# Patient Record
Sex: Female | Born: 1950 | Race: White | Hispanic: No | Marital: Married | Smoking: Never smoker
Health system: Southern US, Community
[De-identification: ages and names within clinical notes are randomized; demographics above are authoritative.]

## PROBLEM LIST (undated history)

## (undated) DIAGNOSIS — H269 Unspecified cataract: Secondary | ICD-10-CM

## (undated) DIAGNOSIS — E7800 Pure hypercholesterolemia, unspecified: Secondary | ICD-10-CM

## (undated) DIAGNOSIS — M810 Age-related osteoporosis without current pathological fracture: Secondary | ICD-10-CM

## (undated) DIAGNOSIS — C801 Malignant (primary) neoplasm, unspecified: Secondary | ICD-10-CM

## (undated) DIAGNOSIS — F419 Anxiety disorder, unspecified: Secondary | ICD-10-CM

## (undated) DIAGNOSIS — E785 Hyperlipidemia, unspecified: Secondary | ICD-10-CM

## (undated) DIAGNOSIS — I10 Essential (primary) hypertension: Secondary | ICD-10-CM

## (undated) HISTORY — DX: Hyperlipidemia, unspecified: E78.5

## (undated) HISTORY — PX: COLONOSCOPY: SHX174

## (undated) HISTORY — PX: ESOPHAGOGASTRODUODENOSCOPY: SHX1529

## (undated) HISTORY — DX: Pure hypercholesterolemia, unspecified: E78.00

## (undated) HISTORY — DX: Unspecified cataract: H26.9

## (undated) HISTORY — DX: Age-related osteoporosis without current pathological fracture: M81.0

---
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
## 1980-09-22 HISTORY — PX: TUBAL LIGATION: SHX77

## 2000-09-22 HISTORY — PX: FACIAL COSMETIC SURGERY: SHX629

## 2005-01-13 ENCOUNTER — Ambulatory Visit: Payer: Self-pay | Admitting: Unknown Physician Specialty

## 2005-01-13 LAB — HM COLONOSCOPY

## 2006-03-09 ENCOUNTER — Ambulatory Visit: Payer: Self-pay | Admitting: Internal Medicine

## 2008-03-23 IMAGING — US SCREENING ULTRASOUND OF ABDOMINAL AORTA
1 series · 15 of 15 positions shown · non-contrast
Comparison: none

REASON FOR EXAM: Abd Bruit Pulsating Mass
COMMENTS:

PROCEDURE:     US  - US EXAM AAA SCREENING  - March 09, 2006  [DATE]
RESULT:     The abdominal aorta is visualized and is normal in appearance.
No aneurysm is seen.  No periaortic mass is noted.

[Series 1: screening ultrasound of abdominal aorta · 15 of 15 slices shown]
[im 1/15]
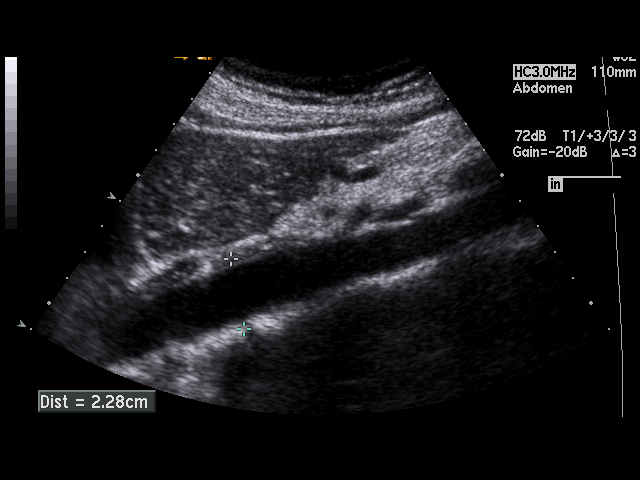
[im 2/15]
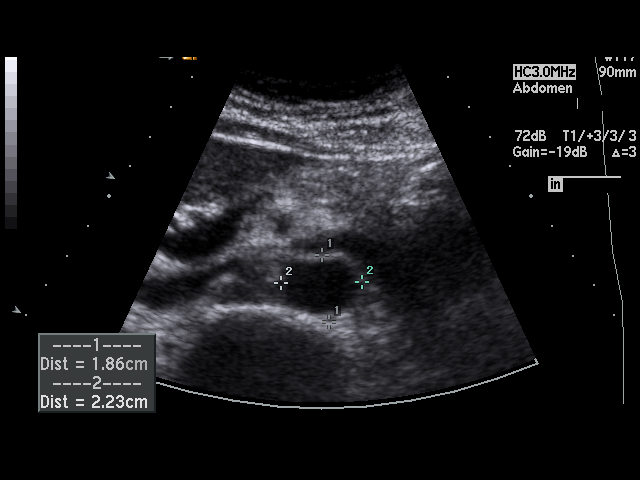
[im 3/15]
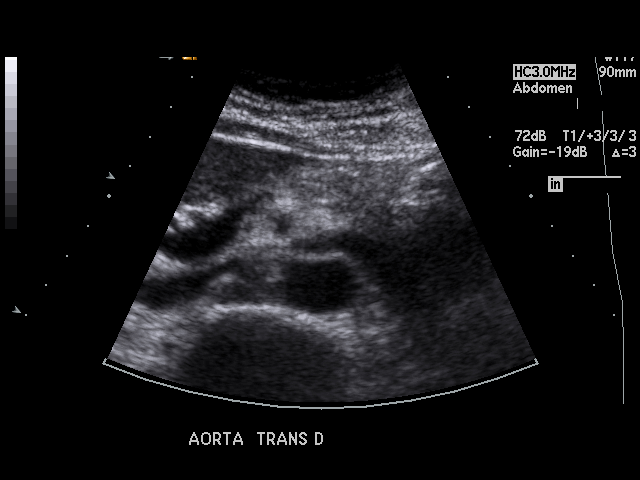
[im 4/15]
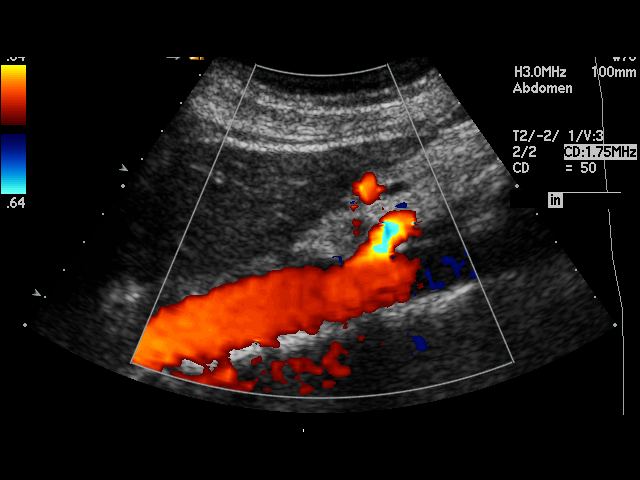
[im 5/15]
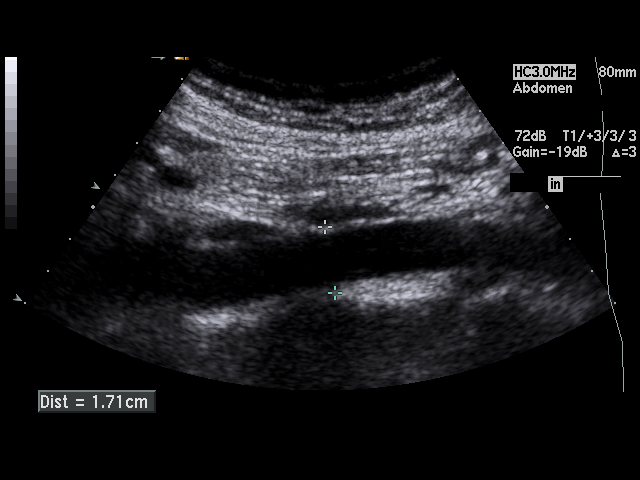
[im 6/15]
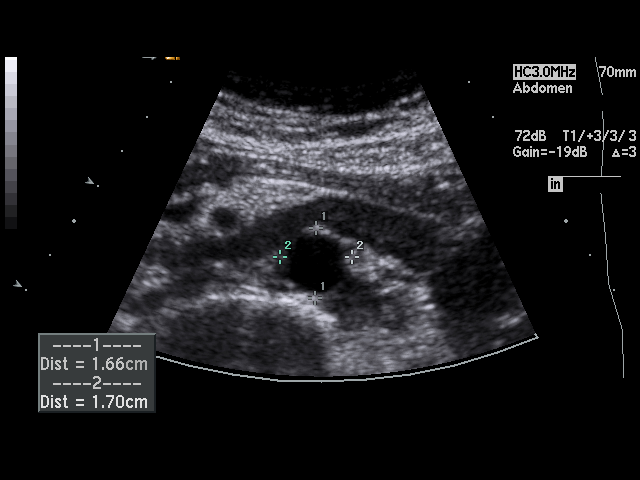
[im 7/15]
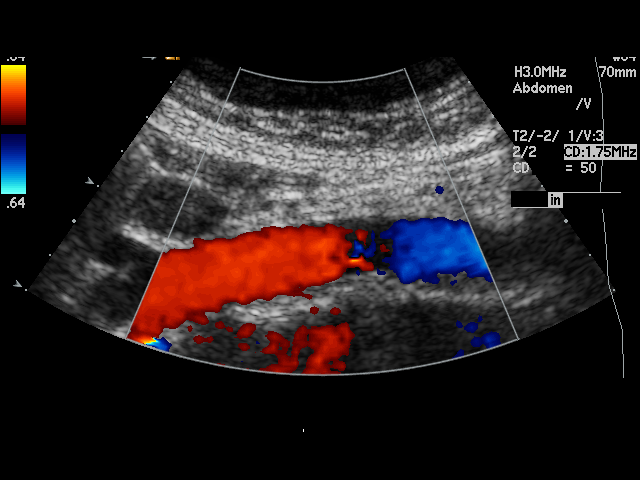
[im 8/15]
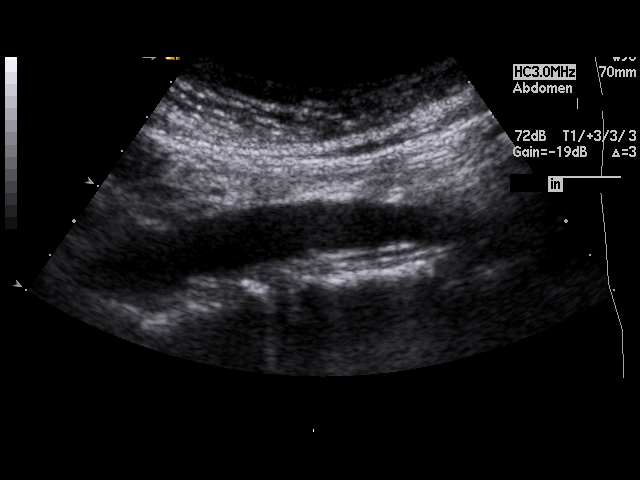
[im 9/15]
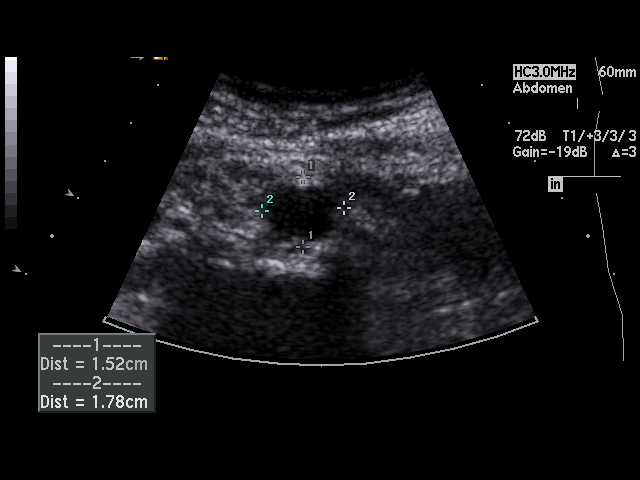
[im 10/15]
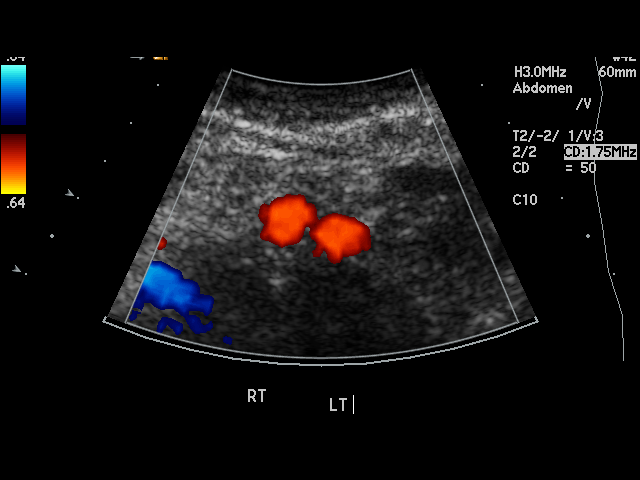
[im 11/15]
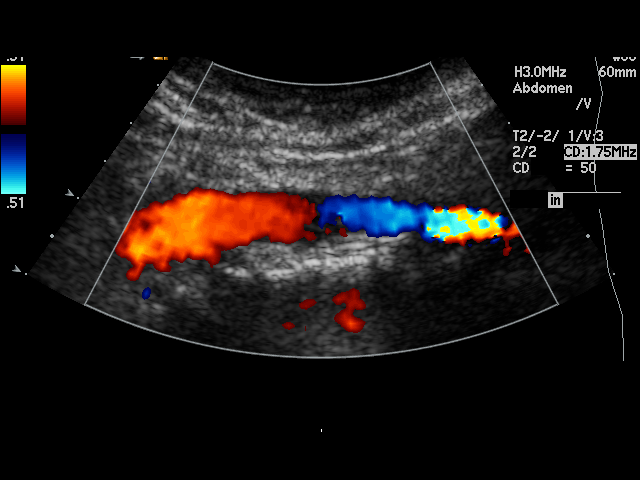
[im 12/15]
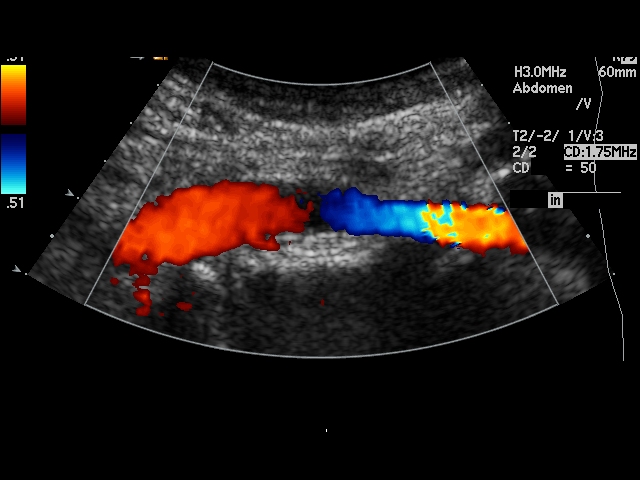
[im 13/15]
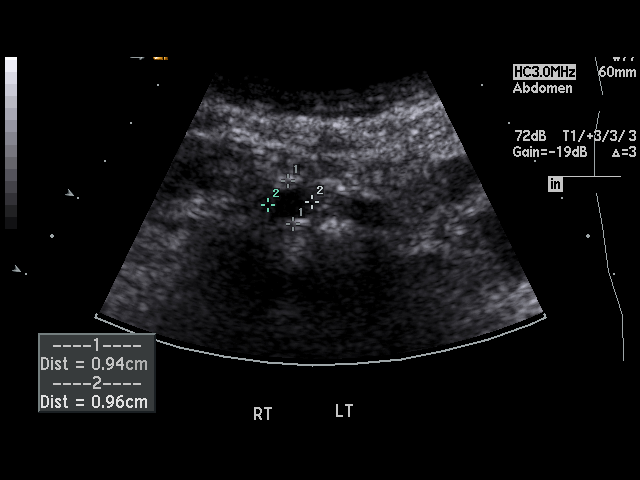
[im 14/15]
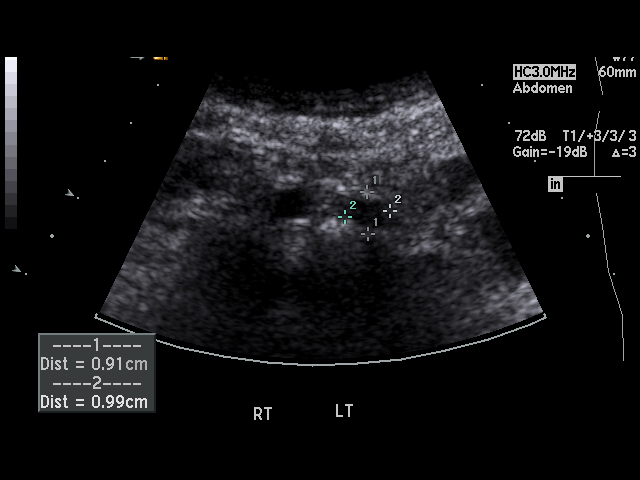
[im 15/15]
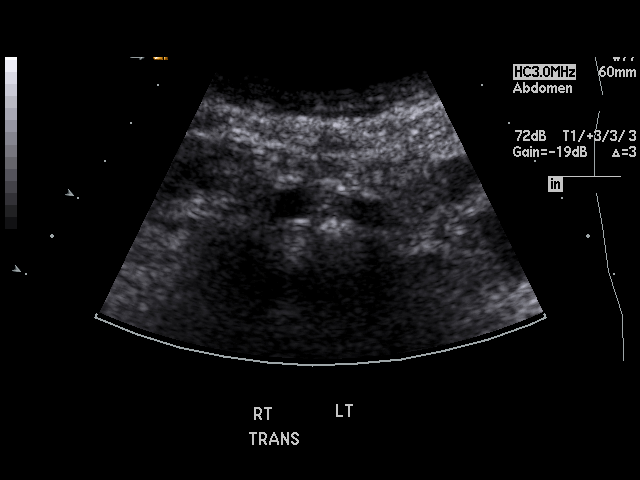

[15 of 15 positions shown; findings below may reference images not displayed]

IMPRESSION: No significant abnormalities are noted.

## 2010-06-11 LAB — HM PAP SMEAR: HM Pap smear: NEGATIVE

## 2011-07-30 LAB — IFOBT (OCCULT BLOOD): IFOBT: NEGATIVE

## 2012-03-08 LAB — HM MAMMOGRAPHY

## 2012-10-25 ENCOUNTER — Ambulatory Visit: Payer: Self-pay | Admitting: Internal Medicine

## 2012-11-02 ENCOUNTER — Encounter: Payer: Self-pay | Admitting: Internal Medicine

## 2012-11-02 DIAGNOSIS — E7800 Pure hypercholesterolemia, unspecified: Secondary | ICD-10-CM | POA: Insufficient documentation

## 2012-11-02 DIAGNOSIS — R519 Headache, unspecified: Secondary | ICD-10-CM | POA: Insufficient documentation

## 2012-11-02 DIAGNOSIS — R51 Headache: Secondary | ICD-10-CM | POA: Insufficient documentation

## 2012-11-02 DIAGNOSIS — E039 Hypothyroidism, unspecified: Secondary | ICD-10-CM

## 2012-11-02 DIAGNOSIS — M810 Age-related osteoporosis without current pathological fracture: Secondary | ICD-10-CM | POA: Insufficient documentation

## 2012-11-07 ENCOUNTER — Encounter: Payer: Self-pay | Admitting: Internal Medicine

## 2012-11-08 ENCOUNTER — Ambulatory Visit (INDEPENDENT_AMBULATORY_CARE_PROVIDER_SITE_OTHER): Payer: BC Managed Care – PPO | Admitting: Internal Medicine

## 2012-11-08 ENCOUNTER — Encounter: Payer: Self-pay | Admitting: Internal Medicine

## 2012-11-08 ENCOUNTER — Ambulatory Visit (INDEPENDENT_AMBULATORY_CARE_PROVIDER_SITE_OTHER)
Admission: RE | Admit: 2012-11-08 | Discharge: 2012-11-08 | Disposition: A | Discharge status: Home / Self Care | Payer: BC Managed Care – PPO | Source: Ambulatory Visit | Attending: Internal Medicine | Admitting: Internal Medicine

## 2012-11-08 ENCOUNTER — Other Ambulatory Visit (HOSPITAL_COMMUNITY)
Admission: RE | Admit: 2012-11-08 | Discharge: 2012-11-08 | Disposition: A | Discharge status: Home / Self Care | Payer: BC Managed Care – PPO | Source: Ambulatory Visit | Attending: Internal Medicine | Admitting: Internal Medicine

## 2012-11-08 VITALS — BP 150/84 | HR 78 | Temp 97.6°F | Ht 66.5 in | Wt 157.0 lb

## 2012-11-08 DIAGNOSIS — R5381 Other malaise: Secondary | ICD-10-CM

## 2012-11-08 DIAGNOSIS — Z1151 Encounter for screening for human papillomavirus (HPV): Secondary | ICD-10-CM | POA: Insufficient documentation

## 2012-11-08 DIAGNOSIS — R51 Headache: Secondary | ICD-10-CM

## 2012-11-08 DIAGNOSIS — N390 Urinary tract infection, site not specified: Secondary | ICD-10-CM

## 2012-11-08 DIAGNOSIS — R05 Cough: Secondary | ICD-10-CM

## 2012-11-08 DIAGNOSIS — R0602 Shortness of breath: Secondary | ICD-10-CM

## 2012-11-08 DIAGNOSIS — Z124 Encounter for screening for malignant neoplasm of cervix: Secondary | ICD-10-CM

## 2012-11-08 DIAGNOSIS — M810 Age-related osteoporosis without current pathological fracture: Secondary | ICD-10-CM

## 2012-11-08 DIAGNOSIS — Z1239 Encounter for other screening for malignant neoplasm of breast: Secondary | ICD-10-CM

## 2012-11-08 DIAGNOSIS — Z01419 Encounter for gynecological examination (general) (routine) without abnormal findings: Secondary | ICD-10-CM | POA: Insufficient documentation

## 2012-11-08 DIAGNOSIS — R059 Cough, unspecified: Secondary | ICD-10-CM

## 2012-11-08 DIAGNOSIS — E7800 Pure hypercholesterolemia, unspecified: Secondary | ICD-10-CM

## 2012-11-08 LAB — POCT URINALYSIS DIPSTICK
Glucose, UA: NEGATIVE
Spec Grav, UA: <=1.005
Urobilinogen, UA: 0.2
pH, UA: 5.5

## 2012-11-08 LAB — COMPREHENSIVE METABOLIC PANEL
AST: 19 U/L (ref 0–37)
Albumin: 4.1 g/dL (ref 3.5–5.2)
BUN: 14 mg/dL (ref 6–23)
Calcium: 9.3 mg/dL (ref 8.4–10.5)
Chloride: 103 mEq/L (ref 96–112)
Glucose, Bld: 97 mg/dL (ref 70–99)
Potassium: 3.7 mEq/L (ref 3.5–5.1)
Sodium: 137 mEq/L (ref 135–145)
Total Protein: 7.7 g/dL (ref 6.0–8.3)

## 2012-11-08 LAB — CBC WITH DIFFERENTIAL/PLATELET
Basophils Relative: 0.9 % (ref 0.0–3.0)
Eosinophils Absolute: 0.1 10*3/uL (ref 0.0–0.7)
Eosinophils Relative: 1.1 % (ref 0.0–5.0)
Lymphocytes Relative: 15.8 % (ref 12.0–46.0)
Neutrophils Relative %: 73.8 % (ref 43.0–77.0)
Platelets: 252 10*3/uL (ref 150.0–400.0)
RBC: 4.5 Mil/uL (ref 3.87–5.11)
WBC: 8.9 10*3/uL (ref 4.5–10.5)

## 2012-11-08 LAB — LIPID PANEL
Cholesterol: 222 mg/dL — ABNORMAL HIGH (ref 0–200)
Total CHOL/HDL Ratio: 3
VLDL: 12.8 mg/dL (ref 0.0–40.0)

## 2012-11-08 MED ORDER — FLUTICASONE PROPIONATE 50 MCG/ACT NA SUSP: 2.0000 | Freq: Every day | NASAL | Status: DC

## 2012-11-08 NOTE — Progress Notes (Signed)
Subjective:    Patient ID: Dana Carney, female    DOB: 09-20-1951, 62 y.o.   MRN: [id number]  HPI 62 year old female with past history of hypercholesterolemia and osteoporosis who comes in today to follow up on these issues as well as for a complete physical exam.  She states she is doing relatively well.  Increased stress at work.  Increased cough - persistent.  Some drainage.  No sinus pressure and no chest congestion.  States the cough has been persistent.  No significant sob with increased activity or exertion.  Some feeling of needing to get a good breath - with coughing.  No chest pain or tightness.  No acid reflux.  Eating and drinking well.  Bowels stable.   Past Medical History  Diagnosis Date  . Hypercholesterolemia   . Osteoporosis     Current Outpatient Prescriptions on File Prior to Visit  Medication Sig Dispense Refill  . Coenzyme Q10 (COQ10 PO) Take by mouth.      . Red Yeast Rice 600 MG CAPS Take 1 capsule by mouth daily.       No current facility-administered medications on file prior to visit.    Review of Systems Patient denies any headache, lightheadedness or dizziness.  Some increased post nasal drainage.  No sinus pressure.  No chest pain, tightness or palpitations.  Does report increased cough and some sob after working.  No sob with increased walking or other exercise.  No acid reflux.  No nausea or vomiting.  No abdominal pain or cramping.  No bowel change, such as diarrhea, constipation, BRBPR or melana.  Was seen at [facility].  Diagnosed with a uti.  Was given abx.  Still with some increased frequency.  Symptoms improved.  Wants urine checked to confirm cleared.          Objective:   Physical Exam Filed Vitals:   11/08/12 0818  BP: 150/84  Pulse: 78  Temp: 97.6 F (36.4 C)   Blood pressure recheck:  72/47 62 year old female in no acute distress.   HEENT:  Nares- clear.  Oropharynx - without lesions. NECK:  Supple.  Nontender.  No audible bruit.   HEART:  Appears to be regular. LUNGS:  No crackles or wheezing audible.  Respirations even and unlabored.  RADIAL PULSE:  Equal bilaterally.    BREASTS:  No nipple discharge or nipple retraction present.  Could not appreciate any distinct nodules or axillary adenopathy.  ABDOMEN:  Soft, nontender.  Bowel sounds present and normal.  No audible abdominal bruit.  GU:  Normal external genitalia.  Vaginal vault without lesions.  Cervix identified.  Pap performed. Could not appreciate any adnexal masses or tenderness.   RECTAL:  Heme negative.   EXTREMITIES:  No increased edema present.  DP pulses palpable and equal bilaterally.           Assessment & Plan:  SINUS DRAINAGE.  Could be contributing to the cough.  Flonase as directed.  Restart Zyrtec.  Do not feel abx warranted.    PERSISTENT COUGH.  See above.  Will treat allergy symptoms.  Check cxr given persistent cough.    CARDIOVASCULAR.  Discussed further w/up including EKG.  She declines.  Currently feels from a cardiac standpoint - stable.  Of note, stress test 04/16/09 - negative for ischemia.   INCREASED PSYCHOSOCIAL STRESSORS.  Feels she is doing relatively well.  Coping with her sons death.  Does not feel she needs any further intervention.  Follow.  FATIGUE.  Did report some fatigue.  May be work related.  Will check cbc,met c and tsh.   HEALTH MAINTENANCE.  Physical today.  Needs colonoscopy.  Refer to GI.  Schedule mammogram.

## 2012-11-09 ENCOUNTER — Encounter: Payer: Self-pay | Admitting: Internal Medicine

## 2012-11-09 ENCOUNTER — Telehealth: Payer: Self-pay | Admitting: Internal Medicine

## 2012-11-09 ENCOUNTER — Ambulatory Visit: Payer: BC Managed Care – PPO

## 2012-11-09 DIAGNOSIS — R5381 Other malaise: Secondary | ICD-10-CM

## 2012-11-09 LAB — URINE CULTURE: Organism ID, Bacteria: NO GROWTH

## 2012-11-09 NOTE — Telephone Encounter (Signed)
I reviewed Dana Carney labs.  Her TSH is suppressed.  I wanted to see if I could add a free T4 and a free T3 to her blood drawn 11/08/12.  Let me know and I will place order for lab.  Thanks.

## 2012-11-09 NOTE — Assessment & Plan Note (Signed)
Low cholesterol diet.  Desires not to take cholesterol medication.  Check lipid panel.

## 2012-11-09 NOTE — Telephone Encounter (Signed)
Patient notified

## 2012-11-09 NOTE — Assessment & Plan Note (Signed)
Not an issue for her now.    

## 2012-11-09 NOTE — Telephone Encounter (Signed)
Patient waiting on results

## 2012-11-09 NOTE — Assessment & Plan Note (Signed)
Has documented osteoporosis.  Per review, bone density 11/27/08 - normal.  Follow.   

## 2012-11-10 ENCOUNTER — Other Ambulatory Visit: Payer: Self-pay | Admitting: Internal Medicine

## 2012-11-10 DIAGNOSIS — E039 Hypothyroidism, unspecified: Secondary | ICD-10-CM

## 2012-11-10 NOTE — Progress Notes (Signed)
Order placed for follow up thyroid test

## 2012-12-13 ENCOUNTER — Other Ambulatory Visit (INDEPENDENT_AMBULATORY_CARE_PROVIDER_SITE_OTHER): Payer: BC Managed Care – PPO

## 2012-12-13 DIAGNOSIS — E039 Hypothyroidism, unspecified: Secondary | ICD-10-CM

## 2012-12-13 LAB — TSH: TSH: 0.49 u[IU]/mL (ref 0.35–5.50)

## 2012-12-14 ENCOUNTER — Other Ambulatory Visit: Payer: Self-pay | Admitting: Internal Medicine

## 2012-12-14 DIAGNOSIS — E7800 Pure hypercholesterolemia, unspecified: Secondary | ICD-10-CM

## 2012-12-14 DIAGNOSIS — R7989 Other specified abnormal findings of blood chemistry: Secondary | ICD-10-CM

## 2012-12-14 NOTE — Progress Notes (Signed)
Orders placed for follow up labs 

## 2012-12-17 ENCOUNTER — Telehealth: Payer: Self-pay | Admitting: *Deleted

## 2012-12-17 NOTE — Telephone Encounter (Signed)
error 

## 2012-12-21 ENCOUNTER — Ambulatory Visit: Payer: Self-pay | Admitting: Unknown Physician Specialty

## 2013-01-19 ENCOUNTER — Encounter: Payer: Self-pay | Admitting: Internal Medicine

## 2013-04-01 ENCOUNTER — Telehealth: Payer: Self-pay | Admitting: Internal Medicine

## 2013-04-01 ENCOUNTER — Ambulatory Visit (INDEPENDENT_AMBULATORY_CARE_PROVIDER_SITE_OTHER): Payer: BC Managed Care – PPO | Admitting: Adult Health

## 2013-04-01 ENCOUNTER — Encounter: Payer: Self-pay | Admitting: Adult Health

## 2013-04-01 VITALS — BP 122/70 | HR 70 | Temp 97.9°F | Resp 12 | Wt 165.0 lb

## 2013-04-01 DIAGNOSIS — R609 Edema, unspecified: Secondary | ICD-10-CM

## 2013-04-01 NOTE — Telephone Encounter (Signed)
FYI patient seeing Raquel today at 11:15.

## 2013-04-01 NOTE — Progress Notes (Signed)
  Subjective:    Patient ID: Dana Carney, female    DOB: Jun 27, 1951, 62 y.o.   MRN: [id number]  HPI Patient is a pleasant 62 year old female who presents to clinic with complaints of left ankle swelling. She works at [employer] third shift and is on her feet the entire shift. She reports her legs feeling tired by the end of her shift. She is currently not using any support hose. Patient reports that when she does use her support hose her legs do not swell. She denies pain in the calf area.   Current Outpatient Prescriptions on File Prior to Visit  Medication Sig Dispense Refill  . calcium gluconate 500 MG tablet Take 500 mg by mouth 2 (two) times daily.      . Coenzyme Q10 (COQ10 PO) Take by mouth.      . fish oil-omega-3 fatty acids 1000 MG capsule Take 2 g by mouth daily.      . Red Yeast Rice 600 MG CAPS Take 1 capsule by mouth daily.      . fluticasone (FLONASE) 50 MCG/ACT nasal spray Place 2 sprays into the nose daily.  16 g  1   No current facility-administered medications on file prior to visit.     Review of Systems  Respiratory: Negative.   Cardiovascular: Positive for leg swelling. Negative for chest pain.       Left ankle swelling  Neurological: Negative for numbness.       Objective:   Physical Exam  Constitutional: She is oriented to person, place, and time. She appears well-developed and well-nourished. No distress.  Cardiovascular: Normal rate, regular rhythm and intact distal pulses.   Excellent pedal pulses  Pulmonary/Chest: Effort normal. No respiratory distress.  Musculoskeletal: She exhibits edema.  Trace edema bilateral lower extremity with just slight more accumulation around the left medial ankle.  Neurological: She is alert and oriented to person, place, and time.  Skin: Skin is warm and dry.  Psychiatric: She has a normal mood and affect. Her behavior is normal. Judgment and thought content normal.          Assessment & Plan:

## 2013-04-01 NOTE — Telephone Encounter (Signed)
Patient Information:  Caller Name: Agustin Cree  Phone: [phone]  Patient: Dana Carney, Dana Carney  Gender: Female  DOB: 10/09/1950  Age: 62 Years  PCP: Orville Govern  Office Follow Up:  Does the office need to follow up with this patient?: No  Instructions For The Office: N/A   Symptoms  Reason For Call & Symptoms: Works nights on concrete floors.  Has noted feeling slightly SOB at times lately.   Noticing Left foot with slight swelling for a week.  By end of work shift foot red on bottom, swelling above ankle with redness.  Difficulty walking due to pain, limping.  Reviewed Health History In EMR: Yes  Reviewed Medications In EMR: Yes  Reviewed Allergies In EMR: Yes  Reviewed Surgeries / Procedures: Yes  Date of Onset of Symptoms: 03/25/2013  Treatments Tried: Had propped up  foot, done epsom salt soaks with temporary relief  Treatments Tried Worked: No  Guideline(s) Used:  Ankle Pain  Disposition Per Guideline:   See Today in Office  Reason For Disposition Reached:   Redness of the skin and no fever  Advice Given:  N/A  Patient Will Follow Care Advice:  YES  Appointment Scheduled:  04/01/2013 11:15:00 Appointment Scheduled Provider:  Orville Govern

## 2013-04-01 NOTE — Assessment & Plan Note (Signed)
Minimal to trace edema bilateral lower extremity with slight more accumulation in the left medial ankle. Suspect this is secondary to being on her feet the entire time she is at work. Patient works at [employer] third shift. Start using support hose for work. Elevate lower extremities when possible. Watch sodium intake. Return to clinic if symptoms are not improved or if symptoms worsen.

## 2013-04-07 ENCOUNTER — Encounter: Payer: Self-pay | Admitting: Internal Medicine

## 2013-05-09 ENCOUNTER — Encounter: Payer: Self-pay | Admitting: Internal Medicine

## 2013-05-09 ENCOUNTER — Ambulatory Visit (INDEPENDENT_AMBULATORY_CARE_PROVIDER_SITE_OTHER): Payer: BC Managed Care – PPO | Admitting: Internal Medicine

## 2013-05-09 VITALS — BP 128/70 | HR 76 | Temp 98.2°F | Ht 66.5 in | Wt 162.8 lb

## 2013-05-09 DIAGNOSIS — M810 Age-related osteoporosis without current pathological fracture: Secondary | ICD-10-CM

## 2013-05-09 DIAGNOSIS — R7989 Other specified abnormal findings of blood chemistry: Secondary | ICD-10-CM

## 2013-05-09 DIAGNOSIS — R609 Edema, unspecified: Secondary | ICD-10-CM

## 2013-05-09 DIAGNOSIS — R51 Headache: Secondary | ICD-10-CM

## 2013-05-09 DIAGNOSIS — R946 Abnormal results of thyroid function studies: Secondary | ICD-10-CM

## 2013-05-09 DIAGNOSIS — E7800 Pure hypercholesterolemia, unspecified: Secondary | ICD-10-CM

## 2013-05-09 LAB — LIPID PANEL
Cholesterol: 239 mg/dL — ABNORMAL HIGH (ref 0–200)
HDL: 55.6 mg/dL (ref >39.00)
Total CHOL/HDL Ratio: 4
Triglycerides: 85 mg/dL (ref 0.0–149.0)
VLDL: 17 mg/dL (ref 0.0–40.0)

## 2013-05-09 LAB — T4, FREE: Free T4: 1 ng/dL (ref 0.60–1.60)

## 2013-05-09 LAB — T3, FREE: T3, Free: 2.7 pg/mL (ref 2.3–4.2)

## 2013-05-09 NOTE — Assessment & Plan Note (Signed)
Low cholesterol diet.  Desires not to take cholesterol medication.  Check lipid panel.

## 2013-05-09 NOTE — Assessment & Plan Note (Signed)
Has documented osteoporosis.  Per review, bone density 11/27/08 - normal.  Follow.   

## 2013-05-09 NOTE — Assessment & Plan Note (Signed)
Not an issue for her now.    

## 2013-05-09 NOTE — Patient Instructions (Signed)
Take zantac 150mg  - one per day.  Take 30 minutes before breakfast.   Also, nasacort - 2 sprays each nostril before bed each day.

## 2013-05-09 NOTE — Assessment & Plan Note (Signed)
Not an issue for her now.  Follow.  

## 2013-05-09 NOTE — Progress Notes (Signed)
Subjective:    Patient ID: Dana Carney, female    DOB: 04/20/1951, 62 y.o.   MRN: [id number]  HPI 62 year old female with past history of hypercholesterolemia and osteoporosis who comes in today for a scheduled follow up.  She states she is doing relatively well.  Increased stress at work.  Cough has resolved.  No sinus pressure and no chest congestion.  Does report some occasional hoarseness and some drainage.  No acid reflux.  Eating and drinking well.  Bowels stable.  She does report some minimal sob.  She also reports chest feels tight occasionally.  Stays active.    Past Medical History  Diagnosis Date  . Hypercholesterolemia   . Osteoporosis     Current Outpatient Prescriptions on File Prior to Visit  Medication Sig Dispense Refill  . calcium gluconate 500 MG tablet Take 500 mg by mouth 2 (two) times daily.      . Coenzyme Q10 (COQ10 PO) Take by mouth.      . fish oil-omega-3 fatty acids 1000 MG capsule Take 2 g by mouth daily.      . Red Yeast Rice 600 MG CAPS Take 1 capsule by mouth daily.      . fluticasone (FLONASE) 50 MCG/ACT nasal spray Place 2 sprays into the nose daily.  16 g  1   No current facility-administered medications on file prior to visit.    Review of Systems Patient denies any headache, lightheadedness or dizziness.  Some increased post nasal drainage.  No sinus pressure.  No chest pain or palpitations.  Some tightness at times. Some minimal sob.  Was questioning whether or not stress could cause some of these symptoms.   Does stay active at work.  No cough.  Feels at times her throat is closing. Feels more related to her esophagus.   No significant tightness.   No acid reflux noted.  No nausea or vomiting.  No swallowing difficulty.  No abdominal pain or cramping.  No bowel change, such as diarrhea, constipation, BRBPR or melana.  Increased stress.  Work stress and has stress with her husbands medical issues.  Feels she is handling stress relatively well.        Objective:   Physical Exam  Filed Vitals:   05/09/13 0804  BP: 128/70  Pulse: 76  Temp: 98.2 F (36.8 C)   Pulse 38 61 year old female in no acute distress.   HEENT:  Nares- clear.  Oropharynx - without lesions. NECK:  Supple.  Nontender.  No audible bruit.  HEART:  Appears to be regular. LUNGS:  No crackles or wheezing audible.  Respirations even and unlabored.  RADIAL PULSE:  Equal bilaterally. ABDOMEN:  Soft, nontender.  Bowel sounds present and normal.  No audible abdominal bruit.    EXTREMITIES:  No increased edema present.  DP pulses palpable and equal bilaterally.           Assessment & Plan:  SINUS DRAINAGE. Change to nasacort as directed.  Saline flushes.  Zyrtec if needed.    PERSISTENT COUGH.  Previous cxr clear.  Cough resolved.      CARDIOVASCULAR.  Discussed further w/up including EKG.  Of note, stress test 04/16/09 - negative for ischemia.  Discussed further cardiac w/u given symptoms.  She declines at this time.  Wants to monitor.  Will notify me if she changes her mind or if symptoms change.    INCREASED PSYCHOSOCIAL STRESSORS.  Feels she is doing relatively well.  Coping with  her sons death.  Does not feel she needs any further intervention.  Follow.    HEALTH MAINTENANCE.  Physical 11/08/12.  Mammogram 03/14/13 - Birads I.  Colonoscopy 12/21/12.

## 2013-05-10 ENCOUNTER — Encounter: Payer: Self-pay | Admitting: *Deleted

## 2013-05-16 ENCOUNTER — Encounter: Payer: Self-pay | Admitting: Internal Medicine

## 2013-07-11 ENCOUNTER — Ambulatory Visit: Payer: BC Managed Care – PPO | Admitting: Internal Medicine

## 2013-10-24 ENCOUNTER — Telehealth: Payer: Self-pay | Admitting: Internal Medicine

## 2013-10-24 ENCOUNTER — Other Ambulatory Visit: Payer: Self-pay | Admitting: Internal Medicine

## 2013-10-24 ENCOUNTER — Ambulatory Visit (INDEPENDENT_AMBULATORY_CARE_PROVIDER_SITE_OTHER): Payer: BC Managed Care – PPO | Admitting: Internal Medicine

## 2013-10-24 ENCOUNTER — Encounter: Payer: Self-pay | Admitting: Internal Medicine

## 2013-10-24 VITALS — BP 138/80 | HR 72 | Temp 98.2°F | Ht 66.5 in | Wt 165.8 lb

## 2013-10-24 DIAGNOSIS — E7800 Pure hypercholesterolemia, unspecified: Secondary | ICD-10-CM

## 2013-10-24 DIAGNOSIS — H3530 Unspecified macular degeneration: Secondary | ICD-10-CM

## 2013-10-24 DIAGNOSIS — R51 Headache: Secondary | ICD-10-CM

## 2013-10-24 DIAGNOSIS — R0989 Other specified symptoms and signs involving the circulatory and respiratory systems: Secondary | ICD-10-CM

## 2013-10-24 DIAGNOSIS — R5383 Other fatigue: Secondary | ICD-10-CM

## 2013-10-24 DIAGNOSIS — M810 Age-related osteoporosis without current pathological fracture: Secondary | ICD-10-CM

## 2013-10-24 DIAGNOSIS — R0609 Other forms of dyspnea: Secondary | ICD-10-CM

## 2013-10-24 DIAGNOSIS — R5381 Other malaise: Secondary | ICD-10-CM

## 2013-10-24 DIAGNOSIS — R0602 Shortness of breath: Secondary | ICD-10-CM

## 2013-10-24 LAB — CBC WITH DIFFERENTIAL/PLATELET
BASOS ABS: 0 10*3/uL (ref 0.0–0.1)
Basophils Relative: 0.4 % (ref 0.0–3.0)
Eosinophils Absolute: 0.2 10*3/uL (ref 0.0–0.7)
Eosinophils Relative: 2.6 % (ref 0.0–5.0)
HEMATOCRIT: 43.5 % (ref 36.0–46.0)
HEMOGLOBIN: 14.4 g/dL (ref 12.0–15.0)
LYMPHS ABS: 1.7 10*3/uL (ref 0.7–4.0)
LYMPHS PCT: 24.4 % (ref 12.0–46.0)
MCHC: 33.1 g/dL (ref 30.0–36.0)
MCV: 94.2 fl (ref 78.0–100.0)
MONOS PCT: 5.5 % (ref 3.0–12.0)
Monocytes Absolute: 0.4 10*3/uL (ref 0.1–1.0)
Neutro Abs: 4.7 10*3/uL (ref 1.4–7.7)
Neutrophils Relative %: 67.1 % (ref 43.0–77.0)
PLATELETS: 265 10*3/uL (ref 150.0–400.0)
RBC: 4.61 Mil/uL (ref 3.87–5.11)
RDW: 13.7 % (ref 11.5–14.6)
WBC: 7.1 10*3/uL (ref 4.5–10.5)

## 2013-10-24 LAB — COMPREHENSIVE METABOLIC PANEL
ALT: 26 U/L (ref 0–35)
AST: 23 U/L (ref 0–37)
Albumin: 4.2 g/dL (ref 3.5–5.2)
Alkaline Phosphatase: 67 U/L (ref 39–117)
BILIRUBIN TOTAL: 1.1 mg/dL (ref 0.3–1.2)
BUN: 21 mg/dL (ref 6–23)
CHLORIDE: 107 meq/L (ref 96–112)
CO2: 25 mEq/L (ref 19–32)
CREATININE: 0.7 mg/dL (ref 0.4–1.2)
Calcium: 9.7 mg/dL (ref 8.4–10.5)
GFR: 98.29 mL/min (ref >60.00)
GLUCOSE: 95 mg/dL (ref 70–99)
Potassium: 3.8 mEq/L (ref 3.5–5.1)
Sodium: 140 mEq/L (ref 135–145)
TOTAL PROTEIN: 6.7 g/dL (ref 6.0–8.3)

## 2013-10-24 LAB — TSH: TSH: 0.82 u[IU]/mL (ref 0.35–5.50)

## 2013-10-24 LAB — LDL CHOLESTEROL, DIRECT: Direct LDL: 166.5 mg/dL

## 2013-10-24 LAB — LIPID PANEL
Cholesterol: 252 mg/dL — ABNORMAL HIGH (ref 0–200)
HDL: 75.7 mg/dL (ref >39.00)
TRIGLYCERIDES: 53 mg/dL (ref 0.0–149.0)
Total CHOL/HDL Ratio: 3
VLDL: 10.6 mg/dL (ref 0.0–40.0)

## 2013-10-24 NOTE — Progress Notes (Signed)
Orders placed for f/u labs.  

## 2013-10-24 NOTE — Progress Notes (Signed)
  Subjective:    Patient ID: Dana Carney, female    DOB: Nov 04, 1950, 63 y.o.   MRN: [id number]  HPI 63 year old female with past history of hypercholesterolemia and osteoporosis who comes in today for a scheduled follow up.  She states she is doing relatively well.  Increased stress at work.  Planning to retire soon.   No sinus pressure and no chest congestion.  No acid reflux.  Eating and drinking well.  Bowels stable.  She does report noticing some sob with exertion.  Occasional chest pain/discomfort associated.  Increased fatigue.  She would like her Lyme titer checked.  Bowels stable.  She is seeing opthalmology for macular degeneration.  Has watery eyes.  Is contemplating a second opinion.     Past Medical History  Diagnosis Date  . Hypercholesterolemia   . Osteoporosis     Current Outpatient Prescriptions on File Prior to Visit  Medication Sig Dispense Refill  . calcium gluconate 500 MG tablet Take 500 mg by mouth 2 (two) times daily.      . Coenzyme Q10 (COQ10 PO) Take by mouth.      . fish oil-omega-3 fatty acids 1000 MG capsule Take 2 g by mouth daily.      . Red Yeast Rice 600 MG CAPS Take 1 capsule by mouth daily.      . fluticasone (FLONASE) 50 MCG/ACT nasal spray Place 2 sprays into the nose daily.  16 g  1   No current facility-administered medications on file prior to visit.    Review of Systems Patient denies any headache, lightheadedness or dizziness.  No sinus pressure or congestion.   No cough.  She does report some sob with exertion.  Some occasional chest discomfort.   Does stay active at work.   No acid reflux noted.  No nausea or vomiting.  No swallowing difficulty.  No abdominal pain or cramping.  No bowel change, such as diarrhea, constipation, BRBPR or melana.  Increased stress.  Work stress and has stress with her husbands medical issues.  Feels she is handling stress relatively well.  Seeing opthalmology as outlined.        Objective:   Physical Exam  Filed  Vitals:   10/24/13 0757  BP: 138/80  Pulse: 72  Temp: 98.2 F (36.8 C)   Blood pressure recheck:  52/30  63 year old female in no acute distress.   HEENT:  Nares- clear.  Oropharynx - without lesions. NECK:  Supple.  Nontender.  No audible bruit.  HEART:  Appears to be regular. LUNGS:  No crackles or wheezing audible.  Respirations even and unlabored.  RADIAL PULSE:  Equal bilaterally. ABDOMEN:  Soft, nontender.  Bowel sounds present and normal.  No audible abdominal bruit.    EXTREMITIES:  No increased edema present.  DP pulses palpable and equal bilaterally.           Assessment & Plan:  CARDIOVASCULAR.  Sob with exertion (and some intermittent chest discomfort).  Increased fatigue.  EKG obtained and revealed SR with no acute ischemic changes.  Will schedule stress echo to confirm no active ischemia.  Further w/up pending results.    INCREASED PSYCHOSOCIAL STRESSORS.  Feels she is doing relatively well.  Coping with her sons death.  Does not feel she needs any further intervention.  Follow.    HEALTH MAINTENANCE.  Physical 11/08/12.  Mammogram 03/14/13 - Birads I.  Colonoscopy 12/21/12.

## 2013-10-24 NOTE — Telephone Encounter (Signed)
Needs Monday appt for stress test, morning if possible.  Will need details on where to go.

## 2013-10-24 NOTE — Telephone Encounter (Signed)
I was waiting to finish her note to place order so that you would have all of the information.  I will finish the note today.

## 2013-10-24 NOTE — Progress Notes (Signed)
Pre-visit discussion using our clinic review tool. No additional management support is needed unless otherwise documented below in the visit note.  

## 2013-10-24 NOTE — Telephone Encounter (Signed)
Scott, See note. I don't have an order. Please advise

## 2013-10-25 ENCOUNTER — Other Ambulatory Visit: Payer: Self-pay | Admitting: Internal Medicine

## 2013-10-25 DIAGNOSIS — E7800 Pure hypercholesterolemia, unspecified: Secondary | ICD-10-CM

## 2013-10-25 LAB — LYME AB/WESTERN BLOT REFLEX
LYME DISEASE AB, QUANT, IGM: <0.8 index (ref 0.00–0.79)
Lyme IgG/IgM Ab: <0.91 {ISR} (ref 0.00–0.90)

## 2013-10-25 LAB — VITAMIN D 25 HYDROXY (VIT D DEFICIENCY, FRACTURES): Vit D, 25-Hydroxy: 66 ng/mL (ref 30–89)

## 2013-10-25 NOTE — Progress Notes (Signed)
Liver panel ordered

## 2013-10-26 ENCOUNTER — Other Ambulatory Visit: Payer: Self-pay | Admitting: *Deleted

## 2013-10-26 MED ORDER — PRAVASTATIN SODIUM 10 MG PO TABS: 10.0000 mg | ORAL_TABLET | Freq: Every day | ORAL | Status: DC

## 2013-10-30 ENCOUNTER — Encounter: Payer: Self-pay | Admitting: Internal Medicine

## 2013-10-30 DIAGNOSIS — H3530 Unspecified macular degeneration: Secondary | ICD-10-CM | POA: Insufficient documentation

## 2013-10-30 NOTE — Assessment & Plan Note (Signed)
Low cholesterol diet.  Has desired not to take cholesterol medication.  Check lipid panel.  If persistent elevation, will need medication.

## 2013-10-30 NOTE — Assessment & Plan Note (Signed)
Not an issue for her now.  Follow.  

## 2013-10-30 NOTE — Assessment & Plan Note (Signed)
Seeing opthalmology.  Contemplating a second opinion.

## 2013-10-30 NOTE — Assessment & Plan Note (Signed)
Has documented osteoporosis.  Per review, bone density 11/27/08 - normal.  Follow.   

## 2013-11-14 ENCOUNTER — Encounter: Payer: Self-pay | Admitting: *Deleted

## 2013-11-17 ENCOUNTER — Other Ambulatory Visit: Payer: BC Managed Care – PPO

## 2013-12-12 ENCOUNTER — Other Ambulatory Visit (INDEPENDENT_AMBULATORY_CARE_PROVIDER_SITE_OTHER): Payer: BC Managed Care – PPO

## 2013-12-12 ENCOUNTER — Telehealth: Payer: Self-pay | Admitting: Internal Medicine

## 2013-12-12 DIAGNOSIS — E7800 Pure hypercholesterolemia, unspecified: Secondary | ICD-10-CM

## 2013-12-12 LAB — HEPATIC FUNCTION PANEL
ALBUMIN: 4 g/dL (ref 3.5–5.2)
ALK PHOS: 68 U/L (ref 39–117)
ALT: 33 U/L (ref 0–35)
AST: 28 U/L (ref 0–37)
Bilirubin, Direct: 0.1 mg/dL (ref 0.0–0.3)
TOTAL PROTEIN: 6.4 g/dL (ref 6.0–8.3)
Total Bilirubin: 0.8 mg/dL (ref 0.3–1.2)

## 2013-12-12 NOTE — Telephone Encounter (Signed)
Pt came in for labs and wanted to get a 3 month rx for chol meds [facility] [address].

## 2013-12-13 ENCOUNTER — Other Ambulatory Visit: Payer: Self-pay | Admitting: Internal Medicine

## 2013-12-13 ENCOUNTER — Encounter: Payer: Self-pay | Admitting: *Deleted

## 2013-12-13 ENCOUNTER — Other Ambulatory Visit: Payer: Self-pay | Admitting: *Deleted

## 2013-12-13 DIAGNOSIS — E7800 Pure hypercholesterolemia, unspecified: Secondary | ICD-10-CM

## 2013-12-13 MED ORDER — PRAVASTATIN SODIUM 10 MG PO TABS: 10.0000 mg | ORAL_TABLET | Freq: Every day | ORAL | Status: DC

## 2013-12-13 NOTE — Telephone Encounter (Signed)
Refill sent for 90 day supply. This was a new medication for her & we wanted to be sure it was tolerated well & her labs improved before sending in a 90 day supply.

## 2013-12-13 NOTE — Progress Notes (Signed)
Order placed for f/u labs.  

## 2013-12-27 ENCOUNTER — Encounter (INDEPENDENT_AMBULATORY_CARE_PROVIDER_SITE_OTHER): Payer: Self-pay

## 2013-12-27 ENCOUNTER — Other Ambulatory Visit (INDEPENDENT_AMBULATORY_CARE_PROVIDER_SITE_OTHER): Payer: BC Managed Care – PPO

## 2013-12-27 DIAGNOSIS — R0609 Other forms of dyspnea: Secondary | ICD-10-CM

## 2013-12-27 DIAGNOSIS — R0989 Other specified symptoms and signs involving the circulatory and respiratory systems: Secondary | ICD-10-CM

## 2013-12-27 DIAGNOSIS — R079 Chest pain, unspecified: Secondary | ICD-10-CM

## 2014-02-02 ENCOUNTER — Other Ambulatory Visit: Payer: BC Managed Care – PPO

## 2014-02-06 ENCOUNTER — Encounter: Payer: BC Managed Care – PPO | Admitting: Internal Medicine

## 2014-02-27 ENCOUNTER — Other Ambulatory Visit (INDEPENDENT_AMBULATORY_CARE_PROVIDER_SITE_OTHER): Payer: BC Managed Care – PPO

## 2014-02-27 ENCOUNTER — Encounter (INDEPENDENT_AMBULATORY_CARE_PROVIDER_SITE_OTHER): Payer: Self-pay

## 2014-02-27 DIAGNOSIS — E7800 Pure hypercholesterolemia, unspecified: Secondary | ICD-10-CM

## 2014-02-27 LAB — COMPREHENSIVE METABOLIC PANEL
ALT: 22 U/L (ref 0–35)
AST: 23 U/L (ref 0–37)
Albumin: 3.7 g/dL (ref 3.5–5.2)
Alkaline Phosphatase: 63 U/L (ref 39–117)
BUN: 18 mg/dL (ref 6–23)
CALCIUM: 9.2 mg/dL (ref 8.4–10.5)
CO2: 25 mEq/L (ref 19–32)
Chloride: 107 mEq/L (ref 96–112)
Creatinine, Ser: 0.6 mg/dL (ref 0.4–1.2)
GFR: 109.79 mL/min (ref >60.00)
Glucose, Bld: 103 mg/dL — ABNORMAL HIGH (ref 70–99)
Potassium: 4.4 mEq/L (ref 3.5–5.1)
Sodium: 139 mEq/L (ref 135–145)
Total Bilirubin: 0.9 mg/dL (ref 0.2–1.2)
Total Protein: 6.4 g/dL (ref 6.0–8.3)

## 2014-02-27 LAB — LIPID PANEL
Cholesterol: 209 mg/dL — ABNORMAL HIGH (ref 0–200)
HDL: 65.7 mg/dL (ref >39.00)
LDL Cholesterol: 130 mg/dL — ABNORMAL HIGH (ref 0–99)
NONHDL: 143.3
Total CHOL/HDL Ratio: 3
Triglycerides: 66 mg/dL (ref 0.0–149.0)
VLDL: 13.2 mg/dL (ref 0.0–40.0)

## 2014-02-28 ENCOUNTER — Encounter: Payer: Self-pay | Admitting: *Deleted

## 2014-03-01 ENCOUNTER — Encounter: Payer: Self-pay | Admitting: Internal Medicine

## 2014-03-01 ENCOUNTER — Ambulatory Visit (INDEPENDENT_AMBULATORY_CARE_PROVIDER_SITE_OTHER): Payer: BC Managed Care – PPO | Admitting: Internal Medicine

## 2014-03-01 VITALS — BP 120/70 | HR 79 | Temp 98.2°F | Ht 66.5 in | Wt 173.5 lb

## 2014-03-01 DIAGNOSIS — M810 Age-related osteoporosis without current pathological fracture: Secondary | ICD-10-CM

## 2014-03-01 DIAGNOSIS — H3530 Unspecified macular degeneration: Secondary | ICD-10-CM

## 2014-03-01 DIAGNOSIS — E7800 Pure hypercholesterolemia, unspecified: Secondary | ICD-10-CM

## 2014-03-01 DIAGNOSIS — M25561 Pain in right knee: Secondary | ICD-10-CM

## 2014-03-01 DIAGNOSIS — M25569 Pain in unspecified knee: Secondary | ICD-10-CM

## 2014-03-01 NOTE — Assessment & Plan Note (Signed)
Recent cholesterol improved.  LDL now 130.  She desires not to increase the pravastatin.  Continue pravastatin and low cholesterol diet and exercise.  Will follow.

## 2014-03-01 NOTE — Progress Notes (Signed)
Pre visit review using our clinic review tool, if applicable. No additional management support is needed unless otherwise documented below in the visit note. 

## 2014-03-01 NOTE — Progress Notes (Signed)
Subjective:    Patient ID: Dana Carney, female    DOB: December 21, 1950, 63 y.o.   MRN: [id number]  HPI 63 year old female with past history of hypercholesterolemia and osteoporosis who comes in today to follow up on these issues as well as for a complete physical exam.   She states she is doing relatively well.  Increased stress at work.  Planning to cut back soon.  Only going to work two days per week.   No sinus pressure and no chest congestion.  No acid reflux.  Eating and drinking well.  Bowels stable.  No chest pain or tightness.  No sob reported.  She did fall recently at work.  Injured her right knee.  Is better.  Still some discomfort.  Able to walk without difficulty.  No other injuries.  Did not hit her head.      Past Medical History  Diagnosis Date  . Hypercholesterolemia   . Osteoporosis     Current Outpatient Prescriptions on File Prior to Visit  Medication Sig Dispense Refill  . calcium gluconate 500 MG tablet Take 500 mg by mouth 2 (two) times daily.      . Coenzyme Q10 (COQ10 PO) Take by mouth.      . fish oil-omega-3 fatty acids 1000 MG capsule Take 2 g by mouth daily.      . pravastatin (PRAVACHOL) 10 MG tablet Take 1 tablet (10 mg total) by mouth daily.  90 tablet  1  . Red Yeast Rice 600 MG CAPS Take 1 capsule by mouth daily.      . fluticasone (FLONASE) 50 MCG/ACT nasal spray Place 2 sprays into the nose daily.  16 g  1   No current facility-administered medications on file prior to visit.    Review of Systems Patient denies any headache, lightheadedness or dizziness.  No sinus pressure or congestion.   No cough.  No sob reported.  No chest pain or tightness.  Does stay active at work.   No acid reflux noted.  No nausea or vomiting.  No swallowing difficulty.  No abdominal pain or cramping.  No bowel change, such as diarrhea, constipation, BRBPR or melana.  Increased stress.  Work stress and has stress with her husbands medical issues.  Feels she is handling stress  relatively well.  Right knee discomfort as outlined.        Objective:   Physical Exam  Filed Vitals:   03/01/14 1121  BP: 120/70  Pulse: 79  Temp: 98.2 F (36.8 C)   Blood pressure recheck:  24/50  63 year old female in no acute distress.   HEENT:  Nares- clear.  Oropharynx - without lesions. NECK:  Supple.  Nontender.  No audible bruit.  HEART:  Appears to be regular. LUNGS:  No crackles or wheezing audible.  Respirations even and unlabored.  RADIAL PULSE:  Equal bilaterally.    BREASTS:  No nipple discharge or nipple retraction present.  Could not appreciate any distinct nodules or axillary adenopathy.  ABDOMEN:  Soft, nontender.  Bowel sounds present and normal.  No audible abdominal bruit.  GU:  Not performed.    EXTREMITIES:  No increased edema present.  DP pulses palpable and equal bilaterally.  MSK:  Minimal soft tissue swelling and tenderness - right knee.  No increased erythema or warmth.  No significant pain with flexion and extension of the knee.              Assessment & Plan:  CARDIOVASCULAR.  Stress echo 12/27/13 - negative.  Currently without symptoms.    INCREASED PSYCHOSOCIAL STRESSORS.  Feels she is doing relatively well.  Coping with her sons death.  Does not feel she needs any further intervention.  Follow.    HEALTH MAINTENANCE.  Physical today.  Mammogram 03/14/13 - Birads I.  Has mammogram scheduled 03/21/14.  Colonoscopy 12/21/12.   I spent 25 minutes with the patient and more than 50% of the time was spent in consultation regarding the above.

## 2014-03-01 NOTE — Assessment & Plan Note (Signed)
Seeing opthalmology.    

## 2014-03-01 NOTE — Assessment & Plan Note (Signed)
Has documented osteoporosis.  Per review, bone density 11/27/08 - normal.  Follow.

## 2014-03-01 NOTE — Assessment & Plan Note (Signed)
Injured at work.  Exam as outlined.  Desires no further w/up at this time.  Feels getting better.  Follow.

## 2014-03-16 ENCOUNTER — Encounter: Payer: BC Managed Care – PPO | Admitting: Internal Medicine

## 2014-03-20 ENCOUNTER — Encounter: Payer: Self-pay | Admitting: *Deleted

## 2014-03-20 LAB — HM MAMMOGRAPHY: HM MAMMO: NEGATIVE

## 2014-11-23 IMAGING — CR DG CHEST 2V
2 series · 2 of 2 positions shown · non-contrast
Comparison: None.

CLINICAL DATA: 69-year-old female with cough and congestion.
Shortness of breath.

CHEST - 2 VIEW

[view not recorded (1 of 2)]
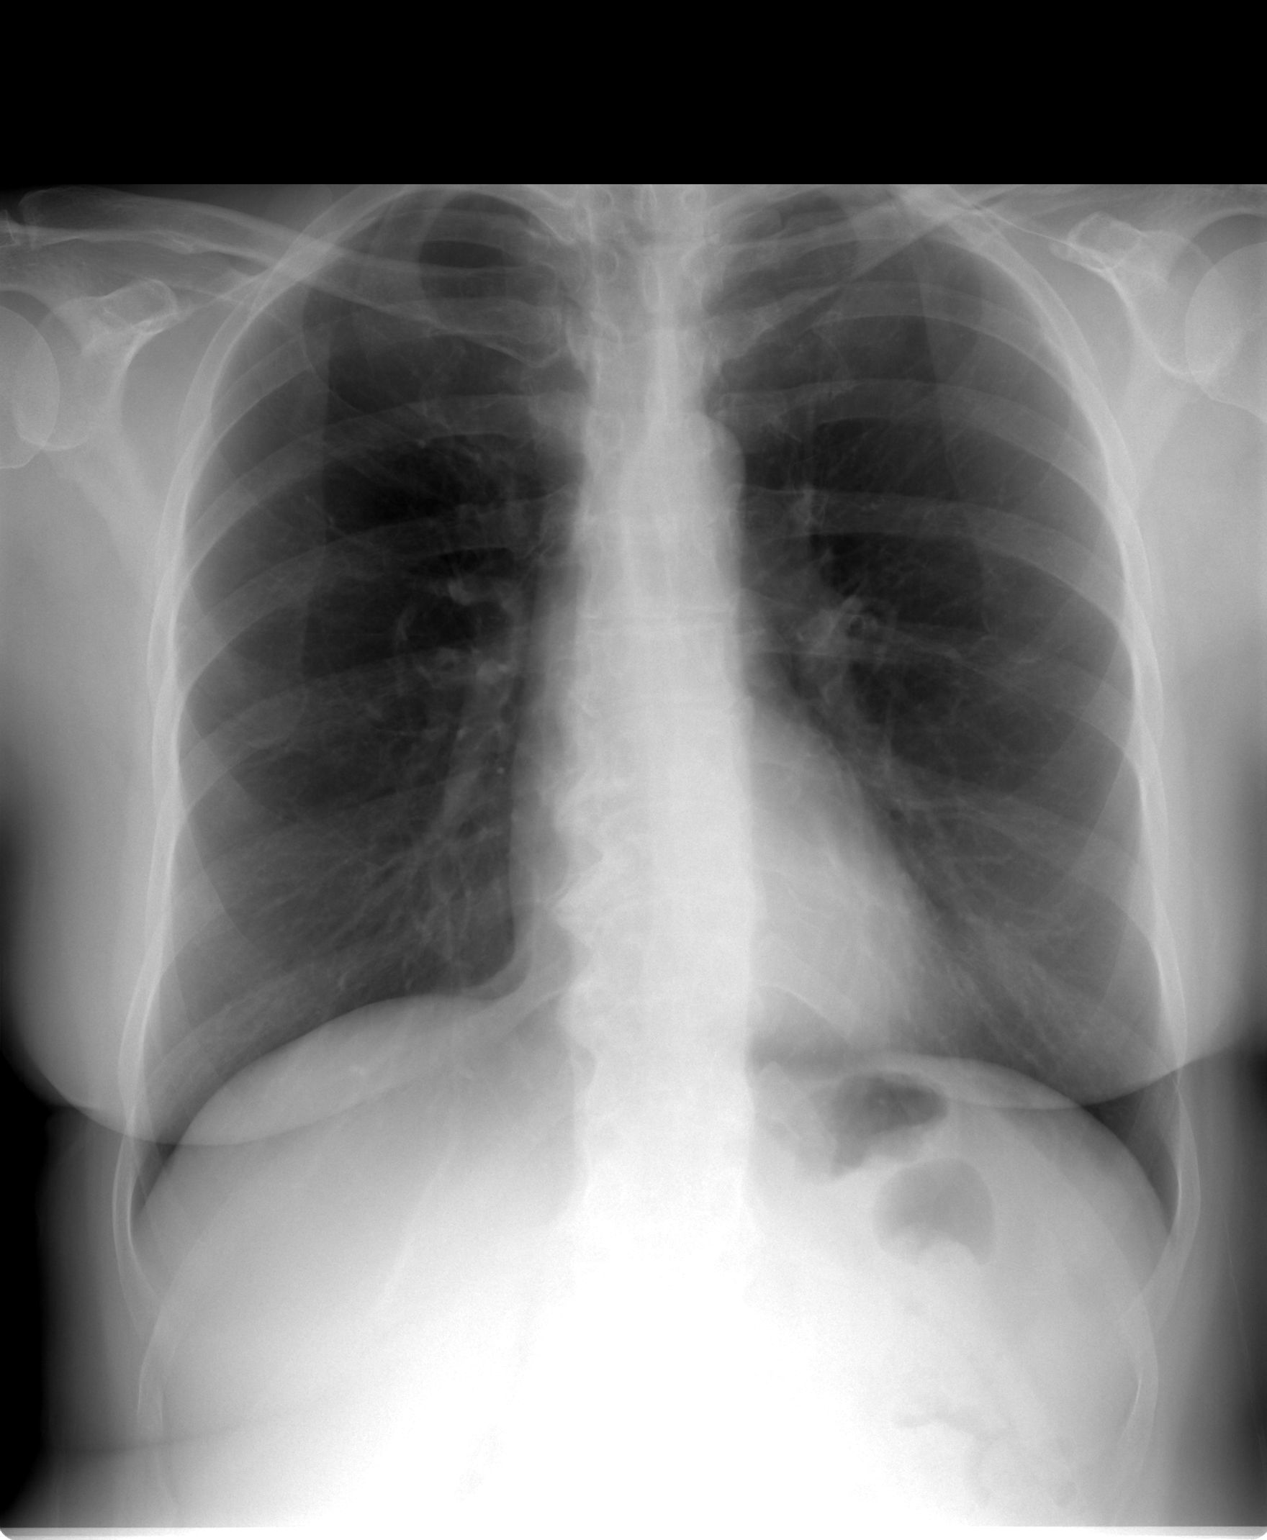

[view not recorded (2 of 2)]
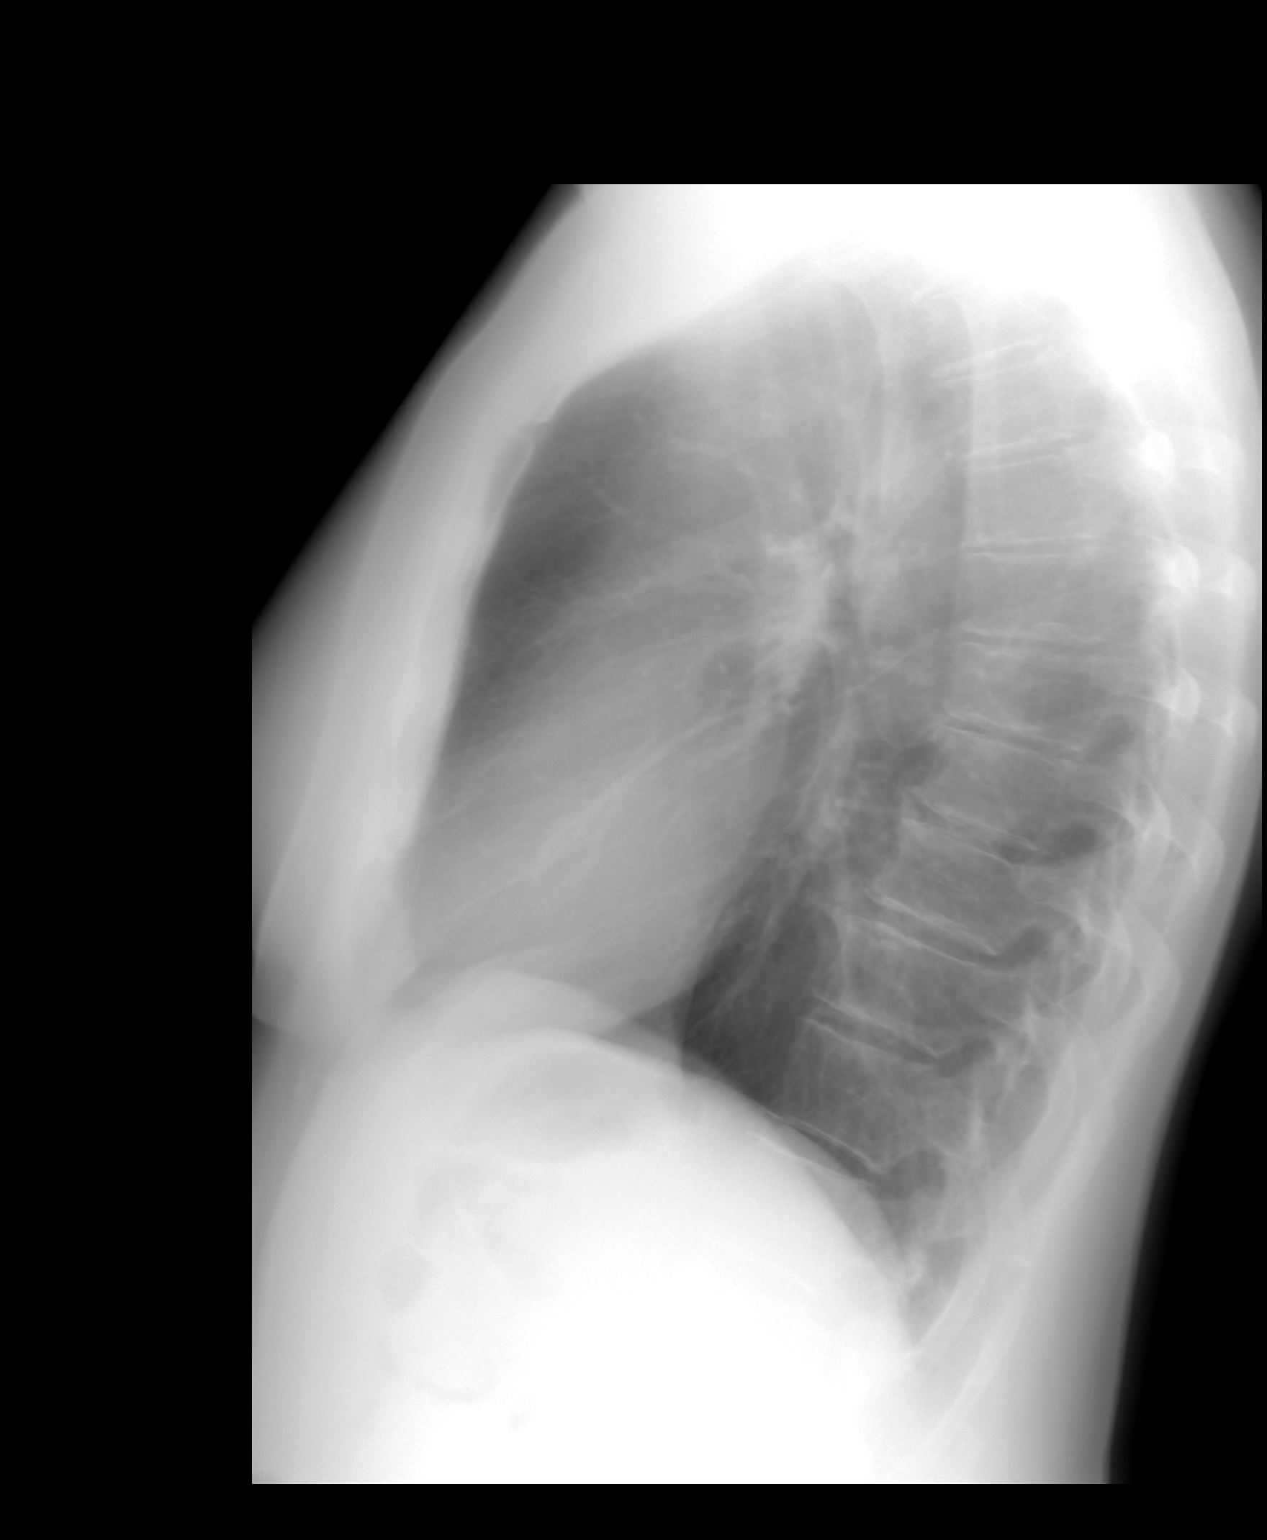

[2 of 2 positions shown; findings below may reference images not displayed]

FINDINGS: Lung volumes are at the upper limits of normal.  Cardiac
size and mediastinal contours are within normal limits.  Visualized
tracheal air column is within normal limits.  No pneumothorax,
pulmonary edema, pleural effusion or confluent pulmonary opacity.
No acute osseous abnormality identified.  Degenerative endplate
spurring in the thoracic spine.
IMPRESSION: No acute cardiopulmonary abnormality.

## 2015-03-02 ENCOUNTER — Telehealth: Payer: Self-pay | Admitting: Internal Medicine

## 2015-03-02 NOTE — Telephone Encounter (Signed)
Spoke to pt, denies any GI symptoms, no abdominal pain. Just wanting an antibiotic sent in because she was told by health fair that she tested positive for h pylori. Advised she would need to be seen in clinic to discuss prior to any medications be called in. Appt has been scheduled with Morey Hummingbird on Monday. Pt still wanted note sent to Dr. Nicki Reaper. I advised her to keep her appt I scheduled on Monday.

## 2015-03-02 NOTE — Telephone Encounter (Signed)
Pt aware.

## 2015-03-02 NOTE — Telephone Encounter (Signed)
Yes agree with evaluation especially with no symptoms.

## 2015-03-02 NOTE — Telephone Encounter (Signed)
Pt stopped by the office to let Dr. Nicki Reaper know that she went to a health fair and tested positive for H. Pylori. Pt is request an antiboditic. Please advise pt.msn

## 2015-03-05 ENCOUNTER — Telehealth: Payer: Self-pay | Admitting: *Deleted

## 2015-03-05 ENCOUNTER — Ambulatory Visit (INDEPENDENT_AMBULATORY_CARE_PROVIDER_SITE_OTHER): Payer: No Typology Code available for payment source | Admitting: Nurse Practitioner

## 2015-03-05 ENCOUNTER — Encounter: Payer: Self-pay | Admitting: Nurse Practitioner

## 2015-03-05 VITALS — BP 120/62 | HR 94 | Temp 98.2°F | Resp 14 | Ht 66.5 in | Wt 173.8 lb

## 2015-03-05 DIAGNOSIS — B9681 Helicobacter pylori [H. pylori] as the cause of diseases classified elsewhere: Secondary | ICD-10-CM | POA: Diagnosis not present

## 2015-03-05 DIAGNOSIS — A048 Other specified bacterial intestinal infections: Secondary | ICD-10-CM | POA: Insufficient documentation

## 2015-03-05 DIAGNOSIS — B351 Tinea unguium: Secondary | ICD-10-CM | POA: Diagnosis not present

## 2015-03-05 MED ORDER — AMOXICILL-CLARITHRO-LANSOPRAZ PO MISC: Freq: Two times a day (BID) | ORAL | Status: DC

## 2015-03-05 NOTE — Assessment & Plan Note (Signed)
Positive x 2 finger prick testing at a [facility] fair, she was told to follow up with her PCP. She reports it is expensive due to her current insurance to follow up often with Dr. Nicki Reaper and she is not interested in being retested with the breath test at our facility today. Minor gastric complaints, will treat with Prevpac asked her to follow up by phone if she has any questions or concerns.

## 2015-03-05 NOTE — Telephone Encounter (Signed)
Pharmacist called states the Prevpak is too expensive.  Requesting individual Rxs be filled which will be a lot cheaper.  Please update Rx.

## 2015-03-05 NOTE — Telephone Encounter (Signed)
Per discussion

## 2015-03-05 NOTE — Progress Notes (Signed)
Pre visit review using our clinic review tool, if applicable. No additional management support is needed unless otherwise documented below in the visit note. 

## 2015-03-05 NOTE — Progress Notes (Signed)
   Subjective:    Patient ID: Dana Carney, female    DOB: 14-Sep-1951, 64 y.o.   MRN: [id number]  HPI  Dana Carney is a 64 yo female with a CC of left great toe nail removed, right has issues, positive for H. Pylori.   1) Works twice a week at [employer]- had a health fair.  Twice   2) Eating feels a little nauseated, belching slightly more.   3) Toes x 6 months- left toenail came off super easily when working outside, right great toenail is thick and yellow as well as half of the second toe, she does not want to do anything that may mess up her liver due to her son dying from liver failure. Cannot see podiatry due to insurance.   Review of Systems  Constitutional: Negative for fever, chills, diaphoresis and fatigue.  Respiratory: Negative for chest tightness, shortness of breath and wheezing.   Cardiovascular: Negative for chest pain, palpitations and leg swelling.  Gastrointestinal: Positive for nausea. Negative for vomiting and diarrhea.       Bloating- upper abdomen only she reports Belching increased, slightly nauseated  Skin: Negative for rash.       Nails fungal changes on feet  Neurological: Negative for dizziness, numbness and headaches.      Objective:   Physical Exam  Constitutional: She is oriented to person, place, and time. She appears well-developed and well-nourished. No distress.  BP 120/62 mmHg  Pulse 94  Temp(Src) 98.2 F (36.8 C) (Oral)  Resp 14  Ht 5' 6.5" (1.689 m)  Wt 173 lb 12.8 oz (78.835 kg)  BMI 27.64 kg/m2  SpO2 97%  LMP 11/09/1987   HENT:  Head: Normocephalic and atraumatic.  Right Ear: External ear normal.  Left Ear: External ear normal.  Eyes: EOM are normal. Pupils are equal, round, and reactive to light. Right eye exhibits no discharge. Left eye exhibits no discharge. No scleral icterus.  Pulmonary/Chest: Breath sounds normal.  Abdominal: Soft. Bowel sounds are normal. She exhibits no distension and no mass. There is no tenderness. There  is no rebound and no guarding.  Neurological: She is alert and oriented to person, place, and time. No cranial nerve deficit. She exhibits normal muscle tone. Coordination normal.  Skin: Skin is warm and dry. No rash noted. She is not diaphoretic.  Left great toenail- gone- nail bed intact Right great toenail- yellow thick and malformed                       2nd toenail on right is top half yellow and thick and bottom half normal toenail.  Psychiatric: She has a normal mood and affect. Her behavior is normal. Judgment and thought content normal.      Assessment & Plan:

## 2015-03-05 NOTE — Assessment & Plan Note (Signed)
Right and left great toenails fungal, 2nd toenail on right has slight fungal changes. Pt cannot do podiatry due to insurance at this time. Does not want to take medication that may increase liver enzymes, her son died from liver failure and failed liver transplant.

## 2015-03-05 NOTE — Patient Instructions (Signed)
Follow the instructions on the prevpac.   Call us if you need [place].

## 2015-03-06 ENCOUNTER — Other Ambulatory Visit: Payer: Self-pay | Admitting: Nurse Practitioner

## 2015-03-06 MED ORDER — ESOMEPRAZOLE MAGNESIUM 40 MG PO CPDR: 40.0000 mg | DELAYED_RELEASE_CAPSULE | Freq: Every day | ORAL | Status: DC

## 2015-03-06 MED ORDER — AMOXICILLIN 500 MG PO TABS: 1000.0000 mg | ORAL_TABLET | Freq: Two times a day (BID) | ORAL | Status: DC

## 2015-03-06 MED ORDER — CLARITHROMYCIN 500 MG PO TABS: 500.0000 mg | ORAL_TABLET | Freq: Two times a day (BID) | ORAL | Status: DC

## 2015-03-06 NOTE — Telephone Encounter (Signed)
Done! Sent individual parts.

## 2018-04-23 ENCOUNTER — Encounter: Payer: Self-pay | Admitting: *Deleted

## 2018-04-26 ENCOUNTER — Other Ambulatory Visit: Payer: Self-pay

## 2018-04-26 ENCOUNTER — Ambulatory Visit
Admission: RE | Admit: 2018-04-26 | Discharge: 2018-04-26 | Disposition: A | Discharge status: Home / Self Care | Payer: Medicare HMO | Source: Ambulatory Visit | Attending: Unknown Physician Specialty | Admitting: Unknown Physician Specialty

## 2018-04-26 ENCOUNTER — Encounter
Admission: RE | Disposition: A | Discharge status: Home / Self Care | Payer: Self-pay | Source: Ambulatory Visit | Attending: Unknown Physician Specialty

## 2018-04-26 ENCOUNTER — Ambulatory Visit: Payer: Medicare HMO | Admitting: Anesthesiology

## 2018-04-26 DIAGNOSIS — Z8371 Family history of colonic polyps: Secondary | ICD-10-CM | POA: Insufficient documentation

## 2018-04-26 DIAGNOSIS — Z1211 Encounter for screening for malignant neoplasm of colon: Secondary | ICD-10-CM | POA: Diagnosis present

## 2018-04-26 DIAGNOSIS — D120 Benign neoplasm of cecum: Secondary | ICD-10-CM | POA: Diagnosis not present

## 2018-04-26 DIAGNOSIS — E7800 Pure hypercholesterolemia, unspecified: Secondary | ICD-10-CM | POA: Insufficient documentation

## 2018-04-26 DIAGNOSIS — D122 Benign neoplasm of ascending colon: Secondary | ICD-10-CM | POA: Insufficient documentation

## 2018-04-26 DIAGNOSIS — D125 Benign neoplasm of sigmoid colon: Secondary | ICD-10-CM | POA: Diagnosis not present

## 2018-04-26 DIAGNOSIS — M810 Age-related osteoporosis without current pathological fracture: Secondary | ICD-10-CM | POA: Insufficient documentation

## 2018-04-26 DIAGNOSIS — F419 Anxiety disorder, unspecified: Secondary | ICD-10-CM | POA: Diagnosis not present

## 2018-04-26 DIAGNOSIS — I10 Essential (primary) hypertension: Secondary | ICD-10-CM | POA: Insufficient documentation

## 2018-04-26 HISTORY — DX: Malignant (primary) neoplasm, unspecified: C80.1

## 2018-04-26 HISTORY — PX: COLONOSCOPY WITH PROPOFOL: SHX5780

## 2018-04-26 HISTORY — DX: Age-related osteoporosis without current pathological fracture: M81.0

## 2018-04-26 HISTORY — DX: Anxiety disorder, unspecified: F41.9

## 2018-04-26 HISTORY — DX: Essential (primary) hypertension: I10

## 2018-04-26 SURGERY — COLONOSCOPY WITH PROPOFOL
Anesthesia: General

## 2018-04-26 MED ORDER — SODIUM CHLORIDE 0.9 % IV SOLN: INTRAVENOUS | Status: DC

## 2018-04-26 MED ORDER — PROPOFOL 10 MG/ML IV BOLUS
INTRAVENOUS | Status: DC | PRN
Start: 2018-04-26 — End: 2018-04-26
  Administered 2018-04-26 (×3): 20 mg via INTRAVENOUS

## 2018-04-26 MED ORDER — LIDOCAINE HCL (PF) 2 % IJ SOLN
INTRAMUSCULAR | Status: DC | PRN
  Administered 2018-04-26: 60 mg

## 2018-04-26 MED ORDER — LIDOCAINE HCL (PF) 2 % IJ SOLN
INTRAMUSCULAR | Status: AC
Start: 2018-04-26 — End: ?
  Filled 2018-04-26: qty 10

## 2018-04-26 MED ORDER — SODIUM CHLORIDE 0.9 % IV SOLN
INTRAVENOUS | Status: DC
  Administered 2018-04-26: 15:00:00 via INTRAVENOUS

## 2018-04-26 MED ORDER — MIDAZOLAM HCL 2 MG/2ML IJ SOLN
INTRAMUSCULAR | Status: AC
  Filled 2018-04-26: qty 2

## 2018-04-26 MED ORDER — FENTANYL CITRATE (PF) 100 MCG/2ML IJ SOLN
INTRAMUSCULAR | Status: AC
  Filled 2018-04-26: qty 2

## 2018-04-26 MED ORDER — MIDAZOLAM HCL 5 MG/5ML IJ SOLN
INTRAMUSCULAR | Status: DC | PRN
  Administered 2018-04-26: 2 mg via INTRAVENOUS

## 2018-04-26 MED ORDER — PROPOFOL 500 MG/50ML IV EMUL
INTRAVENOUS | Status: DC | PRN
  Administered 2018-04-26: 50 ug/kg/min via INTRAVENOUS

## 2018-04-26 MED ORDER — FENTANYL CITRATE (PF) 100 MCG/2ML IJ SOLN
INTRAMUSCULAR | Status: DC | PRN
  Administered 2018-04-26: 25 ug via INTRAVENOUS

## 2018-04-26 NOTE — Op Note (Signed)
[facility] Gastroenterology Patient Name: Dana Carney Procedure Date: 04/26/2018 3:29 PM MRN: [id number] Account #: [id number] Date of Birth: [birth year] Admit Type: Outpatient Age: 67 Room: [facility] ENDO ROOM 3 Gender: Female Note Status: Finalized Procedure:            Colonoscopy Indications:          Colon cancer screening in patient at increased risk:                        Family history of 1st-degree relative with colon polyps Providers:            Manya Silvas, MD Referring MD:         No Local Md, MD (Referring MD) Medicines:            Propofol per Anesthesia Complications:        No immediate complications. Procedure:            Pre-Anesthesia Assessment:                       - After reviewing the risks and benefits, the patient                        was deemed in satisfactory condition to undergo the                        procedure.                       After obtaining informed consent, the colonoscope was                        passed under direct vision. Throughout the procedure,                        the patient's blood pressure, pulse, and oxygen                        saturations were monitored continuously. The                        Colonoscope was introduced through the anus and                        advanced to the the cecum, identified by appendiceal                        orifice and ileocecal valve. The colonoscopy was                        performed without difficulty. The patient tolerated the                        procedure well. The quality of the bowel preparation                        was good. Findings:      A diminutive polyp was found in the cecum. The polyp was sessile. The       polyp was removed with a jumbo cold forceps. Resection and retrieval       were complete.  A diminutive polyp was found in the ascending colon. The polyp was       sessile. The polyp was removed with a jumbo cold forceps. Resection and       retrieval were complete.      A diminutive polyp was found in the distal sigmoid colon. The polyp was       sessile. The polyp was removed with a hot snare. Cold biopsy helped.       Resection and retrieval were complete.      All polyps were small. Impression:           - One diminutive polyp in the cecum, removed with a                        jumbo cold forceps. Resected and retrieved. Recommendation:       - Await pathology results. Manya Silvas, MD 04/26/2018 4:05:12 PM This report has been signed electronically. Number of Addenda: 0 Note Initiated On: 04/26/2018 3:29 PM Scope Withdrawal Time: 0 hours 14 minutes 23 seconds  Total Procedure Duration: 0 hours 23 minutes 7 seconds       [facility]

## 2018-04-26 NOTE — Anesthesia Preprocedure Evaluation (Signed)
Anesthesia Evaluation  Patient identified by MRN, date of birth, ID band Patient awake    Reviewed: Allergy & Precautions, H&P , NPO status , Patient's Chart, lab work & pertinent test results, reviewed documented beta blocker date and time   Airway Mallampati: II   Neck ROM: full    Dental  (+) Poor Dentition   Pulmonary neg pulmonary ROS,    Pulmonary exam normal        Cardiovascular Exercise Tolerance: Poor hypertension, On Medications negative cardio ROS Normal cardiovascular exam Rhythm:regular Rate:Normal     Neuro/Psych  Headaches, Anxiety negative neurological ROS  negative psych ROS   GI/Hepatic negative GI ROS, Neg liver ROS,   Endo/Other  negative endocrine ROS  Renal/GU negative Renal ROS  negative genitourinary   Musculoskeletal   Abdominal   Peds  Hematology negative hematology ROS (+)   Anesthesia Other Findings Past Medical History: No date: Anxiety No date: Cancer ([place])     Comment:  basal cell ca No date: Hypercholesterolemia No date: Hyperlipidemia No date: Hypertension 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, postmenopausal Past Surgical History: No date: COLONOSCOPY No date: ESOPHAGOGASTRODUODENOSCOPY 2002: FACIAL COSMETIC SURGERY 1961: TONSILLECTOMY 1982: TUBAL LIGATION   Reproductive/Obstetrics negative OB ROS                             Anesthesia Physical Anesthesia Plan  ASA: III  Anesthesia Plan: General   Post-op Pain Management:    Induction:   PONV Risk Score and Plan:   Airway Management Planned:   Additional Equipment:   Intra-op Plan:   Post-operative Plan:   Informed Consent: I have reviewed the patients History and Physical, chart, labs and discussed the procedure including the risks, benefits and alternatives for the proposed anesthesia with the patient or authorized representative who has indicated his/her understanding  and acceptance.   Dental Advisory Given  Plan Discussed with: CRNA  Anesthesia Plan Comments:         Anesthesia Quick Evaluation

## 2018-04-26 NOTE — Anesthesia Post-op Follow-up Note (Signed)
Anesthesia QCDR form completed.        

## 2018-04-26 NOTE — H&P (Signed)
Primary Care Physician:  Wayland Denis, PA-C Primary Gastroenterologist:  Dr. Vira Agar  Pre-Procedure History & Physical: HPI:  Dana Carney is a 67 y.o. female is here for an colonoscopy.  Done for FH colon polyps.   Past Medical History:  Diagnosis Date  . Anxiety   . Cancer ([place])    basal cell ca  . Hypercholesterolemia   . Hyperlipidemia   . Hypertension   . Osteoporosis   . Osteoporosis, postmenopausal     Past Surgical History:  Procedure Laterality Date  . COLONOSCOPY    . ESOPHAGOGASTRODUODENOSCOPY    . FACIAL COSMETIC SURGERY  2002  . TONSILLECTOMY  1961  . TUBAL LIGATION  1982    Prior to Admission medications   Medication Sig Start Date End Date Taking? Authorizing Provider  atorvastatin (LIPITOR) 20 MG tablet Take 20 mg by mouth daily.   Yes [provider]  calcium gluconate 500 MG tablet Take 500 mg by mouth 2 (two) times daily.   Yes [provider]  cetirizine (ZYRTEC) 10 MG tablet Take 10 mg by mouth daily.   Yes [provider]  Coenzyme Q10 (COQ10 PO) Take by mouth.   Yes [provider]  Cyanocobalamin (VITAMIN B 12 PO) Take 1,000 mcg by mouth daily.   Yes [provider]  fish oil-omega-3 fatty acids 1000 MG capsule Take 2 g by mouth daily.   Yes [provider]  fluticasone (FLONASE) 50 MCG/ACT nasal spray Place 2 sprays into the nose daily. 11/08/12  Yes Einar Pheasant, MD  Glucosamine-MSM-Hyaluronic Acd ([place]) Take by mouth daily.   Yes [provider]  lisinopril-hydrochlorothiazide (PRINZIDE,ZESTORETIC) 10-12.5 MG tablet Take 1 tablet by mouth daily.   Yes [provider]  Red Yeast Rice 600 MG CAPS Take 1 capsule by mouth daily.   Yes [provider]  amoxicillin (AMOXIL) 500 MG tablet Take 2 tablets (1,000 mg total) by mouth 2 (two) times daily. Patient not taking: Reported on 04/26/2018 03/06/15   Rubbie Battiest, RN  buPROPion [facility] SR) 150 MG  12 hr tablet Take 150 mg by mouth 2 (two) times daily.    [provider]  clarithromycin (BIAXIN) 500 MG tablet Take 1 tablet (500 mg total) by mouth 2 (two) times daily. Patient not taking: Reported on 04/26/2018 03/06/15   Rubbie Battiest, RN  escitalopram (LEXAPRO) 20 MG tablet Take 20 mg by mouth daily.    [provider]  esomeprazole (NEXIUM) 40 MG capsule Take 1 capsule (40 mg total) by mouth daily. Patient not taking: Reported on 04/26/2018 03/06/15   Rubbie Battiest, RN  lisinopril (PRINIVIL,ZESTRIL) 5 MG tablet Take 5 mg by mouth daily.    [provider]  pravastatin (PRAVACHOL) 10 MG tablet Take 1 tablet (10 mg total) by mouth daily. Patient not taking: Reported on 03/05/2015 12/13/13   Einar Pheasant, MD  venlafaxine XR (EFFEXOR-XR) 150 MG 24 hr capsule Take 150 mg by mouth daily with breakfast.    [provider]    Allergies as of 01/13/2018  . (No Known Allergies)    Family History  Problem Relation Age of Onset  . Cancer Mother        lung  . Hypertension Mother   . Diabetes Mother   . Heart attack Father   . Osteoporosis Sister   . Hypertension Sister     Social History   Socioeconomic History  . Marital status: Married    Spouse  name: Not on file  . Number of children: Not on file  . Years of education: Not on file  . Highest education level: Not on file  Occupational History  . Not on file  Social Needs  . Financial resource strain: Not on file  . Food insecurity:    Worry: Not on file    Inability: Not on file  . Transportation needs:    Medical: Not on file    Non-medical: Not on file  Tobacco Use  . Smoking status: Never Smoker  . Smokeless tobacco: Never Used  Substance and Sexual Activity  . Alcohol use: Yes  . Drug use: No  . Sexual activity: Not on file  Lifestyle  . Physical activity:    Days per week: Not on file    Minutes per session: Not on file  . Stress: Not on file  Relationships  . Social  connections:    Talks on phone: Not on file    Gets together: Not on file    Attends religious service: Not on file    Active member of club or organization: Not on file    Attends meetings of clubs or organizations: Not on file    Relationship status: Not on file  . Intimate partner violence:    Fear of current or ex partner: Not on file    Emotionally abused: Not on file    Physically abused: Not on file    Forced sexual activity: Not on file  Other Topics Concern  . Not on file  Social History Narrative  . Not on file    Review of Systems: See HPI, otherwise negative ROS  Physical Exam: BP (!) 144/88   Pulse 83   Temp (!) 96.7 F (35.9 C) (Tympanic)   Resp 16   Ht 5' 6.5" (1.689 m)   Wt 71.7 kg (158 lb)   LMP 11/09/1987   SpO2 100%   BMI 25.12 kg/m  General:   Alert,  pleasant and cooperative in NAD Head:  Normocephalic and atraumatic. Neck:  Supple; no masses or thyromegaly. Lungs:  Clear throughout to auscultation.    Heart:  Regular rate and rhythm. Abdomen:  Soft, nontender and nondistended. Normal bowel sounds, without guarding, and without rebound.   Neurologic:  Alert and  oriented x4;  grossly normal neurologically.  Impression/Plan: Dana Carney is here for an colonoscopy to be performed for [facility] colon polyps.  Risks, benefits, limitations, and alternatives regarding  colonoscopy have been reviewed with the patient.  Questions have been answered.  All parties agreeable.   Gaylyn Cheers, MD  04/26/2018, 3:28 PM

## 2018-04-26 NOTE — Anesthesia Postprocedure Evaluation (Signed)
Anesthesia Post Note  Patient: Dana Carney  Procedure(s) Performed: COLONOSCOPY WITH PROPOFOL (N/A )  Patient location during evaluation: Endoscopy Anesthesia Type: General Level of consciousness: awake and alert and oriented Pain management: pain level controlled Vital Signs Assessment: post-procedure vital signs reviewed and stable Respiratory status: spontaneous breathing, nonlabored ventilation and respiratory function stable Cardiovascular status: blood pressure returned to baseline and stable Postop Assessment: no signs of nausea or vomiting Anesthetic complications: no     Last Vitals:  Vitals:   04/26/18 1624 04/26/18 1634  BP: 135/78 (!) 150/71  Pulse: 68 62  Resp: 14 11  Temp:    SpO2: 98% 100%    Last Pain:  Vitals:   04/26/18 1634  TempSrc:   PainSc: 0-No pain                 Carolle Ishii

## 2018-04-26 NOTE — Transfer of Care (Signed)
Immediate Anesthesia Transfer of Care Note  Patient: Dana Carney  Procedure(s) Performed: COLONOSCOPY WITH PROPOFOL (N/A )  Patient Location: PACU  Anesthesia Type:General  Level of Consciousness: sedated  Airway & Oxygen Therapy: Patient Spontanous Breathing and Patient connected to nasal cannula oxygen  Post-op Assessment: Report given to RN and Post -op Vital signs reviewed and stable  Post vital signs: Reviewed and stable  Last Vitals:  Vitals Value Taken Time  BP 101/62 04/26/2018  4:04 PM  Temp 35.9 C 04/26/2018  4:04 PM  Pulse 62 04/26/2018  4:04 PM  Resp 15 04/26/2018  4:04 PM  SpO2 100 % 04/26/2018  4:04 PM    Last Pain:  Vitals:   04/26/18 1604  TempSrc: Tympanic  PainSc: 0-No pain         Complications: No apparent anesthesia complications

## 2018-04-27 ENCOUNTER — Encounter: Payer: Self-pay | Admitting: Unknown Physician Specialty

## 2018-04-28 LAB — SURGICAL PATHOLOGY

## 2020-10-24 DIAGNOSIS — H2513 Age-related nuclear cataract, bilateral: Secondary | ICD-10-CM | POA: Diagnosis not present

## 2021-05-09 DIAGNOSIS — R030 Elevated blood-pressure reading, without diagnosis of hypertension: Secondary | ICD-10-CM | POA: Diagnosis not present

## 2021-05-09 DIAGNOSIS — R32 Unspecified urinary incontinence: Secondary | ICD-10-CM | POA: Diagnosis not present

## 2021-05-09 DIAGNOSIS — Z6826 Body mass index (BMI) 26.0-26.9, adult: Secondary | ICD-10-CM | POA: Diagnosis not present

## 2021-05-09 DIAGNOSIS — Z833 Family history of diabetes mellitus: Secondary | ICD-10-CM | POA: Diagnosis not present

## 2021-05-09 DIAGNOSIS — E663 Overweight: Secondary | ICD-10-CM | POA: Diagnosis not present

## 2021-05-09 DIAGNOSIS — Z7722 Contact with and (suspected) exposure to environmental tobacco smoke (acute) (chronic): Secondary | ICD-10-CM | POA: Diagnosis not present

## 2021-05-09 DIAGNOSIS — E785 Hyperlipidemia, unspecified: Secondary | ICD-10-CM | POA: Diagnosis not present

## 2021-05-09 DIAGNOSIS — H269 Unspecified cataract: Secondary | ICD-10-CM | POA: Diagnosis not present

## 2021-05-09 DIAGNOSIS — Z008 Encounter for other general examination: Secondary | ICD-10-CM | POA: Diagnosis not present

## 2021-05-09 DIAGNOSIS — M1990 Unspecified osteoarthritis, unspecified site: Secondary | ICD-10-CM | POA: Diagnosis not present

## 2021-05-09 DIAGNOSIS — Z809 Family history of malignant neoplasm, unspecified: Secondary | ICD-10-CM | POA: Diagnosis not present

## 2021-06-14 DIAGNOSIS — Z131 Encounter for screening for diabetes mellitus: Secondary | ICD-10-CM | POA: Diagnosis not present

## 2021-06-14 DIAGNOSIS — E782 Mixed hyperlipidemia: Secondary | ICD-10-CM | POA: Diagnosis not present

## 2021-06-14 DIAGNOSIS — M810 Age-related osteoporosis without current pathological fracture: Secondary | ICD-10-CM | POA: Diagnosis not present

## 2021-06-19 DIAGNOSIS — R300 Dysuria: Secondary | ICD-10-CM | POA: Diagnosis not present

## 2021-06-19 DIAGNOSIS — R3915 Urgency of urination: Secondary | ICD-10-CM | POA: Diagnosis not present

## 2021-06-24 DIAGNOSIS — R030 Elevated blood-pressure reading, without diagnosis of hypertension: Secondary | ICD-10-CM | POA: Diagnosis not present

## 2021-06-24 DIAGNOSIS — Z130 Encounter for screening for diseases of the blood and blood-forming organs and certain disorders involving the immune mechanism: Secondary | ICD-10-CM | POA: Diagnosis not present

## 2021-06-24 DIAGNOSIS — Z0000 Encounter for general adult medical examination without abnormal findings: Secondary | ICD-10-CM | POA: Diagnosis not present

## 2021-06-24 DIAGNOSIS — Z23 Encounter for immunization: Secondary | ICD-10-CM | POA: Diagnosis not present

## 2021-06-24 DIAGNOSIS — M810 Age-related osteoporosis without current pathological fracture: Secondary | ICD-10-CM | POA: Diagnosis not present

## 2021-06-24 DIAGNOSIS — F418 Other specified anxiety disorders: Secondary | ICD-10-CM | POA: Diagnosis not present

## 2021-06-24 DIAGNOSIS — Z131 Encounter for screening for diabetes mellitus: Secondary | ICD-10-CM | POA: Diagnosis not present

## 2021-06-24 DIAGNOSIS — E782 Mixed hyperlipidemia: Secondary | ICD-10-CM | POA: Diagnosis not present

## 2021-06-24 DIAGNOSIS — N3281 Overactive bladder: Secondary | ICD-10-CM | POA: Diagnosis not present

## 2021-06-24 DIAGNOSIS — Z1331 Encounter for screening for depression: Secondary | ICD-10-CM | POA: Diagnosis not present

## 2021-07-29 DIAGNOSIS — Z1231 Encounter for screening mammogram for malignant neoplasm of breast: Secondary | ICD-10-CM | POA: Diagnosis not present

## 2021-10-28 DIAGNOSIS — H2513 Age-related nuclear cataract, bilateral: Secondary | ICD-10-CM | POA: Diagnosis not present

## 2022-01-20 DIAGNOSIS — E785 Hyperlipidemia, unspecified: Secondary | ICD-10-CM | POA: Diagnosis not present

## 2022-01-20 DIAGNOSIS — H269 Unspecified cataract: Secondary | ICD-10-CM | POA: Diagnosis not present

## 2022-01-20 DIAGNOSIS — Z6825 Body mass index (BMI) 25.0-25.9, adult: Secondary | ICD-10-CM | POA: Diagnosis not present

## 2022-01-20 DIAGNOSIS — H04129 Dry eye syndrome of unspecified lacrimal gland: Secondary | ICD-10-CM | POA: Diagnosis not present

## 2022-01-20 DIAGNOSIS — Z7722 Contact with and (suspected) exposure to environmental tobacco smoke (acute) (chronic): Secondary | ICD-10-CM | POA: Diagnosis not present

## 2022-01-20 DIAGNOSIS — E663 Overweight: Secondary | ICD-10-CM | POA: Diagnosis not present

## 2022-01-20 DIAGNOSIS — Z8249 Family history of ischemic heart disease and other diseases of the circulatory system: Secondary | ICD-10-CM | POA: Diagnosis not present

## 2022-01-20 DIAGNOSIS — R030 Elevated blood-pressure reading, without diagnosis of hypertension: Secondary | ICD-10-CM | POA: Diagnosis not present

## 2022-01-20 DIAGNOSIS — R32 Unspecified urinary incontinence: Secondary | ICD-10-CM | POA: Diagnosis not present

## 2022-04-15 DIAGNOSIS — Z79899 Other long term (current) drug therapy: Secondary | ICD-10-CM | POA: Diagnosis not present

## 2022-04-22 DIAGNOSIS — B351 Tinea unguium: Secondary | ICD-10-CM | POA: Diagnosis not present

## 2022-04-22 DIAGNOSIS — E785 Hyperlipidemia, unspecified: Secondary | ICD-10-CM | POA: Diagnosis not present

## 2022-04-22 DIAGNOSIS — R946 Abnormal results of thyroid function studies: Secondary | ICD-10-CM | POA: Diagnosis not present

## 2022-04-22 DIAGNOSIS — I700 Atherosclerosis of aorta: Secondary | ICD-10-CM | POA: Diagnosis not present

## 2022-04-22 DIAGNOSIS — I10 Essential (primary) hypertension: Secondary | ICD-10-CM | POA: Diagnosis not present

## 2022-04-22 DIAGNOSIS — G620 Drug-induced polyneuropathy: Secondary | ICD-10-CM | POA: Diagnosis not present

## 2022-04-22 DIAGNOSIS — R32 Unspecified urinary incontinence: Secondary | ICD-10-CM | POA: Diagnosis not present

## 2022-04-22 DIAGNOSIS — E538 Deficiency of other specified B group vitamins: Secondary | ICD-10-CM | POA: Diagnosis not present

## 2022-05-20 DIAGNOSIS — G620 Drug-induced polyneuropathy: Secondary | ICD-10-CM | POA: Diagnosis not present

## 2022-05-20 DIAGNOSIS — Z599 Problem related to housing and economic circumstances, unspecified: Secondary | ICD-10-CM | POA: Diagnosis not present

## 2022-05-20 DIAGNOSIS — G3184 Mild cognitive impairment, so stated: Secondary | ICD-10-CM | POA: Diagnosis not present

## 2022-05-20 DIAGNOSIS — T466X5S Adverse effect of antihyperlipidemic and antiarteriosclerotic drugs, sequela: Secondary | ICD-10-CM | POA: Diagnosis not present

## 2022-05-20 DIAGNOSIS — E538 Deficiency of other specified B group vitamins: Secondary | ICD-10-CM | POA: Diagnosis not present

## 2022-05-20 DIAGNOSIS — Z5948 Other specified lack of adequate food: Secondary | ICD-10-CM | POA: Diagnosis not present

## 2022-05-29 DIAGNOSIS — H43813 Vitreous degeneration, bilateral: Secondary | ICD-10-CM | POA: Diagnosis not present

## 2022-06-24 DIAGNOSIS — Z0289 Encounter for other administrative examinations: Secondary | ICD-10-CM | POA: Diagnosis not present

## 2022-06-24 DIAGNOSIS — G3184 Mild cognitive impairment, so stated: Secondary | ICD-10-CM | POA: Diagnosis not present

## 2022-06-24 DIAGNOSIS — R946 Abnormal results of thyroid function studies: Secondary | ICD-10-CM | POA: Diagnosis not present

## 2022-06-24 DIAGNOSIS — I10 Essential (primary) hypertension: Secondary | ICD-10-CM | POA: Diagnosis not present

## 2022-07-30 DIAGNOSIS — Z1231 Encounter for screening mammogram for malignant neoplasm of breast: Secondary | ICD-10-CM | POA: Diagnosis not present

## 2022-08-01 DIAGNOSIS — I10 Essential (primary) hypertension: Secondary | ICD-10-CM | POA: Diagnosis not present

## 2022-08-01 DIAGNOSIS — Z0289 Encounter for other administrative examinations: Secondary | ICD-10-CM | POA: Diagnosis not present

## 2022-08-01 DIAGNOSIS — G3184 Mild cognitive impairment, so stated: Secondary | ICD-10-CM | POA: Diagnosis not present

## 2022-08-01 DIAGNOSIS — E785 Hyperlipidemia, unspecified: Secondary | ICD-10-CM | POA: Diagnosis not present

## 2022-08-01 DIAGNOSIS — I700 Atherosclerosis of aorta: Secondary | ICD-10-CM | POA: Diagnosis not present

## 2022-09-11 DIAGNOSIS — Z20818 Contact with and (suspected) exposure to other bacterial communicable diseases: Secondary | ICD-10-CM | POA: Diagnosis not present

## 2022-09-11 DIAGNOSIS — I700 Atherosclerosis of aorta: Secondary | ICD-10-CM | POA: Diagnosis not present

## 2022-09-11 DIAGNOSIS — Z0289 Encounter for other administrative examinations: Secondary | ICD-10-CM | POA: Diagnosis not present

## 2022-09-11 DIAGNOSIS — G3184 Mild cognitive impairment, so stated: Secondary | ICD-10-CM | POA: Diagnosis not present

## 2022-09-11 DIAGNOSIS — I10 Essential (primary) hypertension: Secondary | ICD-10-CM | POA: Diagnosis not present

## 2022-10-23 DIAGNOSIS — I700 Atherosclerosis of aorta: Secondary | ICD-10-CM | POA: Diagnosis not present

## 2022-10-23 DIAGNOSIS — G620 Drug-induced polyneuropathy: Secondary | ICD-10-CM | POA: Diagnosis not present

## 2022-10-23 DIAGNOSIS — Z0289 Encounter for other administrative examinations: Secondary | ICD-10-CM | POA: Diagnosis not present

## 2022-10-23 DIAGNOSIS — T466X5S Adverse effect of antihyperlipidemic and antiarteriosclerotic drugs, sequela: Secondary | ICD-10-CM | POA: Diagnosis not present

## 2022-10-23 DIAGNOSIS — G3184 Mild cognitive impairment, so stated: Secondary | ICD-10-CM | POA: Diagnosis not present

## 2022-11-26 DIAGNOSIS — I10 Essential (primary) hypertension: Secondary | ICD-10-CM | POA: Diagnosis not present

## 2022-11-26 DIAGNOSIS — Z0289 Encounter for other administrative examinations: Secondary | ICD-10-CM | POA: Diagnosis not present

## 2022-11-26 DIAGNOSIS — F419 Anxiety disorder, unspecified: Secondary | ICD-10-CM | POA: Diagnosis not present

## 2022-12-01 DIAGNOSIS — L821 Other seborrheic keratosis: Secondary | ICD-10-CM | POA: Diagnosis not present

## 2022-12-11 DIAGNOSIS — Z809 Family history of malignant neoplasm, unspecified: Secondary | ICD-10-CM | POA: Diagnosis not present

## 2022-12-11 DIAGNOSIS — H547 Unspecified visual loss: Secondary | ICD-10-CM | POA: Diagnosis not present

## 2022-12-11 DIAGNOSIS — Z8249 Family history of ischemic heart disease and other diseases of the circulatory system: Secondary | ICD-10-CM | POA: Diagnosis not present

## 2022-12-11 DIAGNOSIS — H259 Unspecified age-related cataract: Secondary | ICD-10-CM | POA: Diagnosis not present

## 2022-12-11 DIAGNOSIS — Z85828 Personal history of other malignant neoplasm of skin: Secondary | ICD-10-CM | POA: Diagnosis not present

## 2022-12-11 DIAGNOSIS — E785 Hyperlipidemia, unspecified: Secondary | ICD-10-CM | POA: Diagnosis not present

## 2022-12-11 DIAGNOSIS — Z811 Family history of alcohol abuse and dependence: Secondary | ICD-10-CM | POA: Diagnosis not present

## 2022-12-11 DIAGNOSIS — Z818 Family history of other mental and behavioral disorders: Secondary | ICD-10-CM | POA: Diagnosis not present

## 2022-12-11 DIAGNOSIS — I10 Essential (primary) hypertension: Secondary | ICD-10-CM | POA: Diagnosis not present

## 2022-12-11 DIAGNOSIS — I700 Atherosclerosis of aorta: Secondary | ICD-10-CM | POA: Diagnosis not present

## 2023-01-06 DIAGNOSIS — Z0289 Encounter for other administrative examinations: Secondary | ICD-10-CM | POA: Diagnosis not present

## 2023-01-06 DIAGNOSIS — J069 Acute upper respiratory infection, unspecified: Secondary | ICD-10-CM | POA: Diagnosis not present

## 2023-01-06 DIAGNOSIS — Z7689 Persons encountering health services in other specified circumstances: Secondary | ICD-10-CM | POA: Diagnosis not present

## 2023-01-06 DIAGNOSIS — F320 Major depressive disorder, single episode, mild: Secondary | ICD-10-CM | POA: Diagnosis not present

## 2023-01-06 DIAGNOSIS — E785 Hyperlipidemia, unspecified: Secondary | ICD-10-CM | POA: Diagnosis not present

## 2023-01-07 LAB — HEMOGLOBIN A1C: Hemoglobin A1C: 5.4

## 2023-01-12 DIAGNOSIS — J209 Acute bronchitis, unspecified: Secondary | ICD-10-CM | POA: Diagnosis not present

## 2023-01-12 DIAGNOSIS — R051 Acute cough: Secondary | ICD-10-CM | POA: Diagnosis not present

## 2023-01-12 DIAGNOSIS — R110 Nausea: Secondary | ICD-10-CM | POA: Diagnosis not present

## 2023-01-12 DIAGNOSIS — Z6822 Body mass index (BMI) 22.0-22.9, adult: Secondary | ICD-10-CM | POA: Diagnosis not present

## 2023-02-25 DIAGNOSIS — Z0289 Encounter for other administrative examinations: Secondary | ICD-10-CM | POA: Diagnosis not present

## 2023-02-25 DIAGNOSIS — J40 Bronchitis, not specified as acute or chronic: Secondary | ICD-10-CM | POA: Diagnosis not present

## 2023-02-25 DIAGNOSIS — R0600 Dyspnea, unspecified: Secondary | ICD-10-CM | POA: Diagnosis not present

## 2023-02-25 DIAGNOSIS — R1013 Epigastric pain: Secondary | ICD-10-CM | POA: Diagnosis not present

## 2023-04-06 ENCOUNTER — Ambulatory Visit
Admission: RE | Admit: 2023-04-06 | Discharge: 2023-04-06 | Disposition: A | Discharge status: Home / Self Care | Payer: Medicare HMO | Attending: Family Medicine | Admitting: Family Medicine

## 2023-04-06 ENCOUNTER — Ambulatory Visit
Admission: RE | Admit: 2023-04-06 | Discharge: 2023-04-06 | Disposition: A | Discharge status: Home / Self Care | Payer: Medicare HMO | Source: Ambulatory Visit | Attending: Family Medicine | Admitting: Family Medicine

## 2023-04-06 ENCOUNTER — Ambulatory Visit (INDEPENDENT_AMBULATORY_CARE_PROVIDER_SITE_OTHER): Payer: Medicare HMO | Admitting: Family Medicine

## 2023-04-06 ENCOUNTER — Telehealth: Payer: Self-pay | Admitting: Family Medicine

## 2023-04-06 ENCOUNTER — Encounter: Payer: Self-pay | Admitting: Family Medicine

## 2023-04-06 VITALS — BP 123/78 | HR 84 | Temp 98.2°F | Ht 66.5 in | Wt 134.0 lb

## 2023-04-06 DIAGNOSIS — R053 Chronic cough: Secondary | ICD-10-CM | POA: Diagnosis not present

## 2023-04-06 DIAGNOSIS — R0609 Other forms of dyspnea: Secondary | ICD-10-CM

## 2023-04-06 DIAGNOSIS — E441 Mild protein-calorie malnutrition: Secondary | ICD-10-CM

## 2023-04-06 DIAGNOSIS — K219 Gastro-esophageal reflux disease without esophagitis: Secondary | ICD-10-CM | POA: Diagnosis not present

## 2023-04-06 DIAGNOSIS — R944 Abnormal results of kidney function studies: Secondary | ICD-10-CM | POA: Diagnosis not present

## 2023-04-06 NOTE — Telephone Encounter (Signed)
Pt is calling in because she says Dr. Payton Mccallum asked her which medication she was taking for acid reflux and she wanted to let her know it was the Omeprazole - delayed release capsules 20 mgs for acid reflux and she currently has 90 pills.

## 2023-04-06 NOTE — Progress Notes (Unsigned)
New patient visit   Patient: Dana Carney   DOB: 03-14-1951   72 y.o. Female  MRN: [id number] Visit Date: 04/06/2023  Today's healthcare provider: Sherlyn Hay, DO   Chief Complaint  Patient presents with   Establish Care    Patient would like at some point to get a CXR as she states she has had more trouble with her breathing over the last 3 months   Subjective    Dana Carney is a 72 y.o. female who presents today as a new patient to establish care.  HPI HPI     Establish Care    Additional comments: Patient would like at some point to get a CXR as she states she has had more trouble with her breathing over the last 3 months      Last edited by Adline Peals, CMA on 04/06/2023  1:22 PM.      Husband has lung cancer.  - Has been going back and forth to appointments with him  - Had been going to [employer] every month  - 164 lb (approx. 6 months ago) - >  144 lb (April 2024) -> 134 lb  - eating better now and eating less  - Having acid reflux, burping/belching in mornings  - gets full quickly now, especially with the reflux  Breathing:  - Having a lot of draining and coughing a lot  - Coughs because feels draining back of her throat, especially when she is trying to go to sleep  - Has new dyspnea on exertion when walking to the mailbox now  - Her husband has lung cancer and did not stop smoking until he was diagnosed with it   - Her mother had lung cancer too; she also changes not  - She states tired but endorses that she needs more sleep.  - Used to go to [employer]; had to stop when husband became sick  - She does not feel she has the same stamina to exercise.  She tries to do the treadmill and exercise bike at her house sometimes (approximately once per week at this point).  - She still push-mows her lawn  - This has been an ongoing problem for the past 3 months; she is feeling better than she did but is not back to 100%.  - She  is concerned because she lives in a house in which people have consistently chain smoked over many years.  Her husband avoided smoking directly around her, but she is still exposed to the cigarette byproducts that linger on everything.  Seen for acute bronchitis 01/12/2023 - treated with prednisone and amoxicillin  B12 borderline low at [phone])  She endorses having been told she has some cognitive dysfunction in the past  - At this point, she is still able to do all activities on her own.  - Doesn't have a lot of social support locally; brother in area. Grandkids elsewhere.  Colonoscopy - due in end of 2024, appointment set up; 3 polyps last time Mammogram - due in 07/2023    Past Medical History:  Diagnosis Date   Anxiety    Cancer (HCC)    basal cell ca   Cataract    Hypercholesterolemia    Hyperlipidemia    Hypertension    Osteoporosis    Osteoporosis, postmenopausal    Past Surgical History:  Procedure Laterality Date   COLONOSCOPY     COLONOSCOPY WITH PROPOFOL N/A 04/26/2018  Procedure: COLONOSCOPY WITH PROPOFOL;  Surgeon: Scot Jun, MD;  Location: [facility] ENDOSCOPY;  Service: Endoscopy;  Laterality: N/A;   ESOPHAGOGASTRODUODENOSCOPY     FACIAL COSMETIC SURGERY  2002   TONSILLECTOMY  1961   TUBAL LIGATION  1982   Family Status  Relation Name Status   Mother  (Not Specified)   Father  (Not Specified)   Sister  (Not Specified)   Sister  (Not Specified)   MGM  (Not Specified)  No partnership data on file   Family History  Problem Relation Age of Onset   Cancer Mother        lung   Hypertension Mother    Diabetes Mother    Heart attack Father    Osteoporosis Sister    Hypertension Sister    Dementia Maternal Grandmother    Social History   Socioeconomic History   Marital status: Married    Spouse name: Not on file   Number of children: Not on file   Years of education: Not on file   Highest education level: Not on file  Occupational History    Not on file  Tobacco Use   Smoking status: Never   Smokeless tobacco: Never  Vaping Use   Vaping status: Never Used  Substance and Sexual Activity   Alcohol use: Yes   Drug use: No   Sexual activity: Not on file  Other Topics Concern   Not on file  Social History Narrative   Not on file   Social Determinants of Health   Financial Resource Strain: Not on file  Food Insecurity: Not on file  Transportation Needs: Not on file  Physical Activity: Not on file  Stress: Not on file  Social Connections: Not on file   Outpatient Medications Prior to Visit  Medication Sig   atorvastatin (LIPITOR) 10 MG tablet Take 10 mg by mouth daily.   omeprazole (PRILOSEC OTC) 20 MG tablet Take 20 mg by mouth daily.   calcium gluconate 500 MG tablet Take 500 mg by mouth 2 (two) times daily.   Cyanocobalamin (VITAMIN B 12 PO) Take 1,000 mcg by mouth daily. (Patient not taking: Reported on 04/06/2023)   fluticasone (FLONASE) 50 MCG/ACT nasal spray Place 2 sprays into the nose daily. (Patient not taking: Reported on 04/06/2023)   Glucosamine-MSM-Hyaluronic Acd (JOINT HEALTH PO) Take by mouth daily.   [DISCONTINUED] amoxicillin (AMOXIL) 500 MG tablet Take 2 tablets (1,000 mg total) by mouth 2 (two) times daily. (Patient not taking: Reported on 04/26/2018)   [DISCONTINUED] atorvastatin (LIPITOR) 20 MG tablet Take 10 mg by mouth daily.   [DISCONTINUED] buPROPion (WELLBUTRIN SR) 150 MG 12 hr tablet Take 150 mg by mouth 2 (two) times daily.   [DISCONTINUED] cetirizine (ZYRTEC) 10 MG tablet Take 10 mg by mouth daily.   [DISCONTINUED] clarithromycin (BIAXIN) 500 MG tablet Take 1 tablet (500 mg total) by mouth 2 (two) times daily. (Patient not taking: Reported on 04/26/2018)   [DISCONTINUED] Coenzyme Q10 (COQ10 PO) Take by mouth.   [DISCONTINUED] escitalopram (LEXAPRO) 20 MG tablet Take 20 mg by mouth daily. (Patient not taking: Reported on 04/06/2023)   [DISCONTINUED] esomeprazole (NEXIUM) 40 MG capsule Take 1  capsule (40 mg total) by mouth daily. (Patient not taking: Reported on 04/26/2018)   [DISCONTINUED] fish oil-omega-3 fatty acids 1000 MG capsule Take 2 g by mouth daily.   [DISCONTINUED] lisinopril (PRINIVIL,ZESTRIL) 5 MG tablet Take 5 mg by mouth daily.   [DISCONTINUED] lisinopril-hydrochlorothiazide (PRINZIDE,ZESTORETIC) 10-12.5 MG tablet Take 1 tablet by  mouth daily.   [DISCONTINUED] pravastatin (PRAVACHOL) 10 MG tablet Take 1 tablet (10 mg total) by mouth daily. (Patient not taking: Reported on 03/05/2015)   [DISCONTINUED] Red Yeast Rice 600 MG CAPS Take 1 capsule by mouth daily.   [DISCONTINUED] venlafaxine XR (EFFEXOR-XR) 150 MG 24 hr capsule Take 150 mg by mouth daily with breakfast.   No facility-administered medications prior to visit.   No Known Allergies  Immunization History  Administered Date(s) Administered   PFIZER Comirnaty(Gray Top)Covid-19 Tri-Sucrose Vaccine 10/14/2019, 11/04/2019, 06/15/2020, 12/19/2020   PNEUMOCOCCAL CONJUGATE-20 06/17/2022   Pfizer Covid-19 Vaccine Bivalent Booster 3yrs & up 06/05/2021   Pneumococcal Polysaccharide-23 05/28/2017   Pneumococcal-Unspecified 09/01/2005, 06/05/2021   Tdap 08/01/2020   Zoster Recombinant(Shingrix) 07/17/2022, 09/16/2022    Health Maintenance  Topic Date Due   Medicare Annual Wellness (AWV)  Never done   Hepatitis C Screening  Never done   COVID-19 Vaccine (6 - 2023-24 season) 05/23/2022   INFLUENZA VACCINE  04/23/2023   MAMMOGRAM  07/30/2024   Colonoscopy  04/26/2028   DTaP/Tdap/Td (2 - Td or Tdap) 08/01/2030   Pneumonia Vaccine 65+ Years old  Completed   DEXA SCAN  Completed   Zoster Vaccines- Shingrix  Completed   HPV VACCINES  Aged Out    Patient Care Team: Aarohi Redditt, Monico Blitz, DO as PCP - General (Family Medicine)  Review of Systems  Constitutional:  Positive for unexpected weight change.  Respiratory:  Positive for chest tightness (intermittent; not lately; nebulizer) and shortness of breath (on exertion).  Negative for wheezing.   Cardiovascular:  Negative for chest pain, palpitations and leg swelling.  Gastrointestinal:  Positive for nausea (in morning; in particular). Negative for abdominal pain, constipation, diarrhea and vomiting.       +burping/belching    {Insert previous labs (optional):23779}  {See past labs  Heme  Chem  Endocrine  Serology  Results Review (optional):1}   Objective    BP 123/78 (BP Location: Left Arm, Patient Position: Sitting, Cuff Size: Normal)   Pulse 84   Temp 98.2 F (36.8 C) (Oral)   Ht 5' 6.5" (1.689 m)   Wt 134 lb (60.8 kg)   LMP 11/09/1987   SpO2 100%   BMI 21.30 kg/m  {Insert last BP/Wt (optional):23777}  {See vitals history (optional):1}  Physical Exam Vitals and nursing note reviewed.  Constitutional:      General: She is not in acute distress.    Appearance: Normal appearance.  HENT:     Head: Normocephalic and atraumatic.  Eyes:     General: No scleral icterus.    Conjunctiva/sclera: Conjunctivae normal.  Cardiovascular:     Rate and Rhythm: Normal rate.  Pulmonary:     Effort: Pulmonary effort is normal.  Neurological:     Mental Status: She is alert and oriented to person, place, and time. Mental status is at baseline.  Psychiatric:        Mood and Affect: Mood normal.        Behavior: Behavior normal.     Depression Screen    04/06/2023    1:25 PM  PHQ 2/9 Scores  PHQ - 2 Score 2  PHQ- 9 Score 3   No results found for any visits on 04/06/23.  Assessment & Plan     Persistent cough for 3 weeks or longer Assessment & Plan: Patient has had a persistent cough  Orders: -     DG Chest 2 View; Future  Dyspnea on exertion -     DG  Chest 2 View; Future  Decreased calculated GFR  Mild protein-calorie malnutrition (HCC)  Gastroesophageal reflux disease, unspecified whether esophagitis present       Lost 18% body weight in last six months  Start GI med again - patient to let know if helping with daily  dosage. Will refill if so; otherwise will send pantoprazole  Has to blow into meter; doesn't know result Check [facility]  Return in about 4 weeks (around 05/04/2023) for 2-4 weeks for follow-up/recheck.     The entirety of the information documented in the History of Present Illness, Review of Systems and Physical Exam were personally obtained by me. Portions of this information were initially documented by the CMA, Adline Peals, and reviewed by me for thoroughness and accuracy.   I discussed the assessment and treatment plan with the patient  The patient was provided an opportunity to ask questions and all were answered. The patient agreed with the plan and demonstrated an understanding of the instructions.   The patient was advised to call back or seek an in-person evaluation if the symptoms worsen or if the condition fails to improve as anticipated.  Total time was 60 minutes. That includes chart review before the visit, the actual patient visit, and time spent on documentation after the visit.    Sherlyn Hay, DO  [facility] [phone] (phone) [phone] (fax)  [facility]

## 2023-04-09 DIAGNOSIS — R053 Chronic cough: Secondary | ICD-10-CM | POA: Insufficient documentation

## 2023-04-09 DIAGNOSIS — R0609 Other forms of dyspnea: Secondary | ICD-10-CM | POA: Insufficient documentation

## 2023-04-09 HISTORY — DX: Chronic cough: R05.3

## 2023-04-10 NOTE — Assessment & Plan Note (Addendum)
Patient has had a persistent cough over past several months, as well as significant weight loss over the past six months. She does not smoke but has consistently lived with people (her mother and husband) who have chain-smoked, as well as in a home in which people who chain-smoked consistently lived, prior to her and her husband becoming the primary occupants several decades ago.  Will need to review records from the [facility], she was going previously, when they are sent.

## 2023-04-12 DIAGNOSIS — K219 Gastro-esophageal reflux disease without esophagitis: Secondary | ICD-10-CM | POA: Insufficient documentation

## 2023-04-12 DIAGNOSIS — E441 Mild protein-calorie malnutrition: Secondary | ICD-10-CM | POA: Insufficient documentation

## 2023-04-12 DIAGNOSIS — R944 Abnormal results of kidney function studies: Secondary | ICD-10-CM | POA: Insufficient documentation

## 2023-04-12 NOTE — Assessment & Plan Note (Signed)
Patient will be restarting the antireflux medication she was previously prescribed and taking it on a daily basis.  She did not remember the name or dosage during the visit but sent a message subsequently informing me that it is Omeprazole - delayed release capsules 20 mgs and that she currently has 90 pills.  If this does not effectively address her reflux, will send pantoprazole.

## 2023-04-12 NOTE — Assessment & Plan Note (Signed)
Patient has history of decreased eGFR between 2019 and 2021.  She does have one level of normal eGFR in 2022.  Will request recent records, as patient believes that she has had recent lab work, and continue to monitor.

## 2023-04-12 NOTE — Assessment & Plan Note (Signed)
Patient reports going from 164 pounds in the last 6 months down to 134 today.  This amounts to 18% of her body weight, which I discussed with her is very concerning.  Also endorsed increased satiety, with increased burping and belching.  She attributes this to her reflux.  However, will go ahead and do a chest x-ray today as noted above.

## 2023-04-22 ENCOUNTER — Ambulatory Visit: Payer: Medicare HMO | Admitting: Family Medicine

## 2023-05-04 ENCOUNTER — Ambulatory Visit: Payer: Medicare HMO

## 2023-05-05 ENCOUNTER — Ambulatory Visit: Payer: Medicare HMO | Admitting: Family Medicine

## 2023-05-05 ENCOUNTER — Ambulatory Visit (INDEPENDENT_AMBULATORY_CARE_PROVIDER_SITE_OTHER): Payer: Medicare HMO | Admitting: Family Medicine

## 2023-05-05 ENCOUNTER — Encounter: Payer: Self-pay | Admitting: Family Medicine

## 2023-05-05 VITALS — BP 115/68 | HR 71 | Ht 67.0 in | Wt 135.7 lb

## 2023-05-05 DIAGNOSIS — R739 Hyperglycemia, unspecified: Secondary | ICD-10-CM

## 2023-05-05 DIAGNOSIS — K219 Gastro-esophageal reflux disease without esophagitis: Secondary | ICD-10-CM

## 2023-05-05 DIAGNOSIS — E538 Deficiency of other specified B group vitamins: Secondary | ICD-10-CM | POA: Diagnosis not present

## 2023-05-05 DIAGNOSIS — R0609 Other forms of dyspnea: Secondary | ICD-10-CM | POA: Diagnosis not present

## 2023-05-05 DIAGNOSIS — G3184 Mild cognitive impairment, so stated: Secondary | ICD-10-CM | POA: Insufficient documentation

## 2023-05-05 DIAGNOSIS — I700 Atherosclerosis of aorta: Secondary | ICD-10-CM | POA: Diagnosis not present

## 2023-05-05 DIAGNOSIS — Z0000 Encounter for general adult medical examination without abnormal findings: Secondary | ICD-10-CM | POA: Insufficient documentation

## 2023-05-05 NOTE — Assessment & Plan Note (Signed)
Significantly improved at this point.  Will continue to monitor in the future.

## 2023-05-05 NOTE — Progress Notes (Unsigned)
Established patient visit   Patient: Dana Carney   DOB: 05-Jul-1951   72 y.o. Female  MRN: [id number] Visit Date: 05/05/2023  Today's healthcare provider: Sherlyn Hay, DO   Chief Complaint  Patient presents with  . Medical Management of Chronic Issues   Subjective    HPI  GERD, Follow up:  The patient was last seen for GERD 4 weeks ago. Changes made since that visit include restarting her omeprazole that was previously prescribed.  She reports excellent compliance with treatment. She is not having side effects.  She IS experiencing no symptoms at this point. She is NOT experiencing abdominal bloating, cough, early satiety, heartburn, or nausea  -----------------------------------------------------------------------------------------  Push mowed front yard yesterday. Did rest after completing the front yard but states that she did ok with it.  Breathing is improved  Significant stressors due to husband's lung cancer.  {History (Optional):23778}  Medications: Outpatient Medications Prior to Visit  Medication Sig  . atorvastatin (LIPITOR) 10 MG tablet Take 10 mg by mouth daily.  . calcium gluconate 500 MG tablet Take 500 mg by mouth 2 (two) times daily.  . Glucosamine-MSM-Hyaluronic Acd (JOINT HEALTH PO) Take by mouth daily.  Marland Kitchen omeprazole (PRILOSEC OTC) 20 MG tablet Take 20 mg by mouth daily.  . [DISCONTINUED] Cyanocobalamin (VITAMIN B 12 PO) Take 1,000 mcg by mouth daily. (Patient not taking: Reported on 04/06/2023)  . [DISCONTINUED] fluticasone (FLONASE) 50 MCG/ACT nasal spray Place 2 sprays into the nose daily. (Patient not taking: Reported on 04/06/2023)   No facility-administered medications prior to visit.    Review of Systems  Constitutional:  Negative for appetite change, chills, fatigue and fever.  Eyes:  Positive for visual disturbance (eye floaters; sees ophthalmology next month).  Respiratory:  Negative for chest tightness and shortness of breath.    Cardiovascular:  Negative for chest pain and palpitations.  Gastrointestinal:  Negative for abdominal pain, nausea and vomiting.  Neurological:  Negative for dizziness and weakness.    {Insert previous labs (optional):23779} {See past labs  Heme  Chem  Endocrine  Serology  Results Review (optional):1}   Objective    BP 115/68 (BP Location: Left Arm, Patient Position: Sitting, Cuff Size: Normal)   Pulse 71   Ht 5\' 7"  (1.702 m)   Wt 135 lb 11.2 oz (61.6 kg)   LMP 11/09/1987   SpO2 100%   BMI 21.25 kg/m  {Insert last BP/Wt (optional):23777}{See vitals history (optional):1}   Physical Exam Vitals reviewed.  Constitutional:      General: She is not in acute distress.    Appearance: Normal appearance. She is well-developed. She is not diaphoretic.  HENT:     Head: Normocephalic and atraumatic.     Right Ear: Tympanic membrane, ear canal and external ear normal.     Left Ear: Tympanic membrane, ear canal and external ear normal.     Nose: Nose normal.     Mouth/Throat:     Mouth: Mucous membranes are moist.     Pharynx: Oropharynx is clear. No oropharyngeal exudate.  Eyes:     General: No scleral icterus.    Conjunctiva/sclera: Conjunctivae normal.     Pupils: Pupils are equal, round, and reactive to light.  Neck:     Thyroid: No thyromegaly.  Cardiovascular:     Rate and Rhythm: Normal rate and regular rhythm.     Pulses: Normal pulses.     Heart sounds: Normal heart sounds. No murmur heard. Pulmonary:  Effort: Pulmonary effort is normal. No respiratory distress.     Breath sounds: Normal breath sounds. No wheezing or rales.  Abdominal:     General: There is no distension.     Palpations: Abdomen is soft.     Tenderness: There is no abdominal tenderness.  Musculoskeletal:        General: No deformity.     Cervical back: Neck supple.     Right lower leg: No edema.     Left lower leg: No edema.  Lymphadenopathy:     Cervical: No cervical adenopathy.   Skin:    General: Skin is warm and dry.     Findings: No rash.  Neurological:     Mental Status: She is alert and oriented to person, place, and time. Mental status is at baseline.     Gait: Gait normal.  Psychiatric:        Mood and Affect: Mood normal.        Behavior: Behavior normal.        Thought Content: Thought content normal.     No results found for any visits on 05/05/23.  Assessment & Plan    Gastroesophageal reflux disease, unspecified whether esophagitis present Assessment & Plan: As of 04/06/2023, patient had omeprazole - delayed release capsules 20 mgs for acid reflux (90 pills) and restarted these.  She is doing well on this and will continue it out to a total of 2 to 3 months duration of use, then stop and see how she does off of the medication.  She will request a refill if her symptoms recur.    Decreased calculated GFR  Dyspnea on exertion Assessment & Plan: Significantly improved at this point.  Will continue to monitor in the future.     A1c 5.4 on 01/07/2023    ***  NEEDS LABS EXTRACTED!!! (Media tab)  No follow-ups on file.      I discussed the assessment and treatment plan with the patient  The patient was provided an opportunity to ask questions and all were answered. The patient agreed with the plan and demonstrated an understanding of the instructions.   The patient was advised to call back or seek an in-person evaluation if the symptoms worsen or if the condition fails to improve as anticipated.    Sherlyn Hay, DO  [facility] [phone] (phone) [phone] (fax)  [facility]

## 2023-05-05 NOTE — Assessment & Plan Note (Addendum)
As of 04/06/2023, patient had omeprazole - delayed release capsules 20 mgs for acid reflux (90 pills) and restarted these.  She is doing well on this and will continue it out to a total of 2 to 3 months duration of use, then stop and see how she does off of the medication.  She will request a refill if her symptoms recur.

## 2023-05-07 ENCOUNTER — Encounter: Payer: Self-pay | Admitting: Family Medicine

## 2023-05-07 DIAGNOSIS — R739 Hyperglycemia, unspecified: Secondary | ICD-10-CM | POA: Insufficient documentation

## 2023-05-07 NOTE — Assessment & Plan Note (Signed)
Per record review, patient does have history of B12 deficiency.  Will check her level at her next visit.

## 2023-05-07 NOTE — Assessment & Plan Note (Addendum)
Found in patient's records. Seen on echocardiogram 04/15/2022.  Continue atorvastatin.  Encouraged patient to continue with heart healthy diet and regular exercise.  Goal BP <130/80.

## 2023-05-07 NOTE — Assessment & Plan Note (Signed)
Patient reports history of borderline hyperglycemia.  Her record shows a historical A1c of 5.7 (which was within normal limits for the reference lab used).  However, since then, she has had an A1c level of 5.4 on 01/07/2023.  This is likely secondary to her weight loss.  Will continue to monitor in the future.

## 2023-05-21 ENCOUNTER — Encounter: Payer: Self-pay | Admitting: Family Medicine

## 2023-05-21 ENCOUNTER — Ambulatory Visit: Payer: Self-pay

## 2023-05-21 ENCOUNTER — Ambulatory Visit (INDEPENDENT_AMBULATORY_CARE_PROVIDER_SITE_OTHER): Payer: Medicare HMO | Admitting: Family Medicine

## 2023-05-21 VITALS — BP 107/68 | HR 100 | Ht 67.0 in | Wt 131.8 lb

## 2023-05-21 DIAGNOSIS — U071 COVID-19: Secondary | ICD-10-CM

## 2023-05-21 LAB — POC COVID19 BINAXNOW: SARS Coronavirus 2 Ag: POSITIVE — AB

## 2023-05-21 MED ORDER — BENZONATATE 100 MG PO CAPS: 100.0000 mg | ORAL_CAPSULE | Freq: Two times a day (BID) | ORAL | 0 refills | Status: AC | PRN

## 2023-05-21 NOTE — Progress Notes (Signed)
Acute Office Visit  Subjective:     Patient ID: Dana Carney, female    DOB: July 19, 1951, 72 y.o.   MRN: [id number]  Chief Complaint  Patient presents with   Covid Positive    Took an at home test and was POSITIVE. Symptoms are runny nose and coldlike symptoms    HPI Discussed the use of AI scribe software for clinical note transcription with the patient, who gave verbal consent to proceed.  History of Present Illness   The patient, with a history of acid reflux, presents after testing positive for COVID-19. She reports symptoms of a runny nose and cough, which started two days ago. She also mentions feeling a little nauseous and having diarrhea. The patient has lost significant weight recently, dropping from 167 to 131 pounds over a period of months due to decreased appetite. She has been managing her acid reflux with medication and has been taking over-the-counter Mucinex for congestion.  The patient's husband, who has COPD and recently underwent treatment for lung cancer, has also been experiencing symptoms, including diarrhea. The patient has been caring for her husband, who is currently on oxygen and has a tube in place. She is concerned about her ability to continue providing care while managing her own symptoms.       ROS per HPI      Objective:    BP 107/68 (BP Location: Left Arm, Patient Position: Sitting, Cuff Size: Normal)   Pulse 100   Ht 5\' 7"  (1.702 m)   Wt 131 lb 12.8 oz (59.8 kg)   LMP 11/09/1987   SpO2 100%   BMI 20.64 kg/m    Physical Exam Vitals reviewed.  Constitutional:      General: She is not in acute distress.    Appearance: She is well-developed.  HENT:     Head: Normocephalic and atraumatic.  Eyes:     General: No scleral icterus.    Conjunctiva/sclera: Conjunctivae normal.  Cardiovascular:     Rate and Rhythm: Normal rate and regular rhythm.     Heart sounds: Normal heart sounds. No murmur heard. Pulmonary:     Effort: Pulmonary  effort is normal. No respiratory distress.     Breath sounds: Normal breath sounds. No wheezing or rales.  Skin:    General: Skin is warm and dry.     Findings: No rash.  Neurological:     Mental Status: She is alert and oriented to person, place, and time.  Psychiatric:        Behavior: Behavior normal.     Results for orders placed or performed in visit on 05/21/23  POC COVID-19  Result Value Ref Range   SARS Coronavirus 2 Ag Positive (A) Negative        Assessment & Plan:   Problem List Items Addressed This Visit   None Visit Diagnoses     COVID    -  Primary   Relevant Orders   POC COVID-19 (Completed)           COVID-19 Positive test with symptoms of nasal congestion and diarrhea. Discussed the current less severe strain and the expected course of illness. -Continue supportive care with over-the-counter medications for symptom management. -Consider Tessalon for cough if it develops. -Continue masking around high-risk husband for 10 days from symptom onset.  Caregiver stress Patient is primary caregiver for husband with recent lung cancer and COPD. Expressed concern about managing her own illness while caring for her husband. -Encouraged to contact  husband's care team at [facility] regarding her symptoms and potential COVID-19 exposure. -Consider zinc supplementation for husband after discussing with her care team.  Follow-up Patient due for follow-up in February. -Schedule follow-up appointment in February as previously planned.        Meds ordered this encounter  Medications   benzonatate (TESSALON) 100 MG capsule    Sig: Take 1 capsule (100 mg total) by mouth 2 (two) times daily as needed for cough.    Dispense:  20 capsule    Refill:  0    Return if symptoms worsen or fail to improve.  Shirlee Latch, MD

## 2023-05-21 NOTE — Telephone Encounter (Signed)
  Chief Complaint: COVID+ Symptoms: Diarrhea, Cough, runny nose, feels poorly Frequency: Yesterday Pertinent Negatives: Patient denies fever Disposition: [] ED /[] Urgent Care (no appt availability in office) / [x] Appointment(In office/virtual)/ []  [facility]/ [] Home Care/ [] Refused Recommended Disposition /[] [facility]/ []  Follow-up with PCP Additional Notes: Pt reports s/s starting yesterday. + test today. Pt states that her husband just came home from from hospital for lung CA. She thinks he may have brought it home form the hospital, but is unsure. Pt is interested in anti-viral medication. Appt scheduled in office.  Summary: covid   Pt called saying she tested positive for covid and her husband just got out of the hospital with cancer surgery.  CB@  [phone]     Reason for Disposition  [1] HIGH RISK patient (e.g., weak immune system, age > 64 years, obesity with BMI 30 or higher, pregnant, chronic lung disease or other chronic medical condition) AND [2] COVID symptoms (e.g., cough, fever)  (Exceptions: Already seen by PCP and no new or worsening symptoms.)  Answer Assessment - Initial Assessment Questions 1. COVID-19 DIAGNOSIS: "How do you know that you have COVID?" (e.g., positive lab test or self-test, diagnosed by doctor or NP/PA, symptoms after exposure).     Tested + today 2. COVID-19 EXPOSURE: "Was there any known exposure to COVID before the symptoms began?" CDC Definition of close contact: within 6 feet (2 meters) for a total of 15 minutes or more over a 24-hour period.      Unsure - may have gotten from Husband 3. ONSET: "When did the COVID-19 symptoms start?"      Yesterday 4. WORST SYMPTOM: "What is your worst symptom?" (e.g., cough, fever, shortness of breath, muscle aches)     Runny nose - feels poorly,, Diarrhea 5. COUGH: "Do you have a cough?" If Yes, ask: "How bad is the cough?"       cough 6. FEVER: "Do you have a fever?" If Yes, ask:  "What is your temperature, how was it measured, and when did it start?"     no 7. RESPIRATORY STATUS: "Describe your breathing?" (e.g., normal; shortness of breath, wheezing, unable to speak)      no 8. BETTER-SAME-WORSE: "Are you getting better, staying the same or getting worse compared to yesterday?"  If getting worse, ask, "In what way?"     Same 9. OTHER SYMPTOMS: "Do you have any other symptoms?"  (e.g., chills, fatigue, headache, loss of smell or taste, muscle pain, sore throat)     cough 10. HIGH RISK DISEASE: "Do you have any chronic medical problems?" (e.g., asthma, heart or lung disease, weak immune system, obesity, etc.)       Anxiety 11. VACCINE: "Have you had the COVID-19 vaccine?" If Yes, ask: "Which one, how many shots, when did you get it?"       yes  Protocols used: Coronavirus (COVID-19) Diagnosed or Suspected-A-AH

## 2023-06-17 ENCOUNTER — Ambulatory Visit: Payer: Medicare HMO | Admitting: Family Medicine

## 2023-06-25 DIAGNOSIS — H43813 Vitreous degeneration, bilateral: Secondary | ICD-10-CM | POA: Diagnosis not present

## 2023-06-25 DIAGNOSIS — H35361 Drusen (degenerative) of macula, right eye: Secondary | ICD-10-CM | POA: Diagnosis not present

## 2023-06-25 DIAGNOSIS — H2513 Age-related nuclear cataract, bilateral: Secondary | ICD-10-CM | POA: Diagnosis not present

## 2023-06-25 DIAGNOSIS — M3501 Sicca syndrome with keratoconjunctivitis: Secondary | ICD-10-CM | POA: Diagnosis not present

## 2023-07-06 ENCOUNTER — Other Ambulatory Visit: Payer: Self-pay | Admitting: Family Medicine

## 2023-07-06 NOTE — Telephone Encounter (Unsigned)
Copied from CRM [id number]. Topic: General - Other >> Jul 06, 2023  8:02 AM Everette C wrote: Reason for CRM: Medication Refill - Medication: atorvastatin (LIPITOR) 10 MG tablet [[id number]] - 90 day supply   Has the patient contacted their pharmacy? Yes.   (Agent: If no, request that the patient contact the pharmacy for the refill. If patient does not wish to contact the pharmacy document the reason why and proceed with request.) (Agent: If yes, when and what did the pharmacy advise?)  Preferred Pharmacy (with phone number or street name): [facility] [address] [place] - [address] [place] [address] Phone: [phone] Fax: [phone] Hours: Not open 24 hours   Has the patient been seen for an appointment in the last year OR does the patient have an upcoming appointment? Yes.    Agent: Please be advised that RX refills may take up to 3 business days. We ask that you follow-up with your pharmacy.

## 2023-07-07 MED ORDER — ATORVASTATIN CALCIUM 10 MG PO TABS: 10.0000 mg | ORAL_TABLET | Freq: Every day | ORAL | 0 refills | Status: DC

## 2023-07-07 NOTE — Telephone Encounter (Signed)
Requested medications are due for refill today.  unsure  Requested medications are on the active medications list.  yes  Last refill. 04/06/2023  Future visit scheduled.   no  Notes to clinic.  Medication is historical.    Requested Prescriptions  Pending Prescriptions Disp Refills   atorvastatin (LIPITOR) 10 MG tablet      Sig: Take 1 tablet (10 mg total) by mouth daily.     Cardiovascular:  Antilipid - Statins Failed - 07/06/2023  8:31 AM      Failed - Lipid Panel in normal range within the last 12 months    Cholesterol  Date Value Ref Range Status  02/27/2014 209 (H) 0 - 200 mg/dL Final    Comment:    ATP III Classification       Desirable:  < 200 mg/dL               Borderline High:  200 - 239 mg/dL          High:  > = 161 mg/dL   LDL Cholesterol  Date Value Ref Range Status  02/27/2014 130 (H) 0 - 99 mg/dL Final   Direct LDL  Date Value Ref Range Status  10/24/2013 166.5 mg/dL Final    Comment:    Optimal:  <100 mg/dLNear or Above Optimal:  100-129 mg/dLBorderline High:  130-159 mg/dLHigh:  160-189 mg/dLVery High:  >190 mg/dL   HDL  Date Value Ref Range Status  02/27/2014 65.70 >39.00 mg/dL Final   Triglycerides  Date Value Ref Range Status  02/27/2014 66.0 0.0 - 149.0 mg/dL Final    Comment:    Normal:  <150 mg/dLBorderline High:  150 - 199 mg/dL         Passed - Patient is not pregnant      Passed - Valid encounter within last 12 months    Recent Outpatient Visits           1 month ago COVID   [facility] [place], Marzella Schlein, MD   2 months ago Gastroesophageal reflux disease, unspecified whether esophagitis present   [facility] [place], Monico Blitz, DO   3 months ago Persistent cough for 3 weeks or longer   [facility] [place], Monico Blitz, DO

## 2023-07-08 ENCOUNTER — Telehealth: Payer: Self-pay

## 2023-07-08 NOTE — Telephone Encounter (Signed)
Please review

## 2023-07-08 NOTE — Telephone Encounter (Signed)
Copied from CRM [id number]. Topic: Appointment Scheduling - Scheduling Inquiry for Clinic >> Jul 08, 2023 11:38 AM Epimenio Foot F wrote: Reason for CRM: Pt is calling in because she would like to schedule labs so she doesn't have any issues getting her medication refilled when it's time. Please follow up with pt. Pt is able to come tomorrow if not tomorrow Monday or Tuesday next week or after Wednesday of next week.

## 2023-07-13 ENCOUNTER — Other Ambulatory Visit: Payer: Self-pay | Admitting: Family Medicine

## 2023-07-13 DIAGNOSIS — R739 Hyperglycemia, unspecified: Secondary | ICD-10-CM

## 2023-07-13 DIAGNOSIS — E441 Mild protein-calorie malnutrition: Secondary | ICD-10-CM

## 2023-07-13 DIAGNOSIS — E7800 Pure hypercholesterolemia, unspecified: Secondary | ICD-10-CM

## 2023-07-13 DIAGNOSIS — E538 Deficiency of other specified B group vitamins: Secondary | ICD-10-CM

## 2023-07-13 DIAGNOSIS — I700 Atherosclerosis of aorta: Secondary | ICD-10-CM

## 2023-07-13 NOTE — Telephone Encounter (Signed)
Patient's husband Kylar Fahl said he will let the patient know.

## 2023-07-14 DIAGNOSIS — E538 Deficiency of other specified B group vitamins: Secondary | ICD-10-CM | POA: Diagnosis not present

## 2023-07-14 DIAGNOSIS — E7800 Pure hypercholesterolemia, unspecified: Secondary | ICD-10-CM | POA: Diagnosis not present

## 2023-07-14 DIAGNOSIS — I700 Atherosclerosis of aorta: Secondary | ICD-10-CM | POA: Diagnosis not present

## 2023-07-14 DIAGNOSIS — R739 Hyperglycemia, unspecified: Secondary | ICD-10-CM | POA: Diagnosis not present

## 2023-07-14 DIAGNOSIS — E441 Mild protein-calorie malnutrition: Secondary | ICD-10-CM | POA: Diagnosis not present

## 2023-07-15 LAB — COMPREHENSIVE METABOLIC PANEL
ALT: 18 [IU]/L (ref 0–32)
AST: 19 [IU]/L (ref 0–40)
Albumin: 5.2 g/dL — ABNORMAL HIGH (ref 3.8–4.8)
Alkaline Phosphatase: 90 [IU]/L (ref 44–121)
BUN/Creatinine Ratio: 26 (ref 12–28)
BUN: 18 mg/dL (ref 8–27)
Bilirubin Total: 0.8 mg/dL (ref 0.0–1.2)
CO2: 23 mmol/L (ref 20–29)
Calcium: 9.7 mg/dL (ref 8.7–10.3)
Chloride: 105 mmol/L (ref 96–106)
Creatinine, Ser: 0.7 mg/dL (ref 0.57–1.00)
Globulin, Total: 1.2 g/dL — ABNORMAL LOW (ref 1.5–4.5)
Glucose: 101 mg/dL — ABNORMAL HIGH (ref 70–99)
Potassium: 4.2 mmol/L (ref 3.5–5.2)
Sodium: 142 mmol/L (ref 134–144)
Total Protein: 6.4 g/dL (ref 6.0–8.5)
eGFR: 92 mL/min/{1.73_m2} (ref >59)

## 2023-07-15 LAB — LIPID PANEL
Chol/HDL Ratio: 2.7 ratio (ref 0.0–4.4)
Cholesterol, Total: 190 mg/dL (ref 100–199)
HDL: 70 mg/dL (ref >39)
LDL Chol Calc (NIH): 105 mg/dL — ABNORMAL HIGH (ref 0–99)
Triglycerides: 85 mg/dL (ref 0–149)
VLDL Cholesterol Cal: 15 mg/dL (ref 5–40)

## 2023-07-15 LAB — HEMOGLOBIN A1C
Est. average glucose Bld gHb Est-mCnc: 105 mg/dL
Hgb A1c MFr Bld: 5.3 % (ref 4.8–5.6)

## 2023-07-15 LAB — VITAMIN B12: Vitamin B-12: 214 pg/mL — ABNORMAL LOW (ref 232–1245)

## 2023-08-03 DIAGNOSIS — Z1231 Encounter for screening mammogram for malignant neoplasm of breast: Secondary | ICD-10-CM | POA: Diagnosis not present

## 2023-08-03 LAB — HM MAMMOGRAPHY

## 2023-08-04 ENCOUNTER — Encounter: Payer: Self-pay | Admitting: Family Medicine

## 2023-09-07 ENCOUNTER — Other Ambulatory Visit: Payer: Self-pay | Admitting: Family Medicine

## 2023-09-07 NOTE — Telephone Encounter (Signed)
Medication Refill -  Most Recent Primary Care Visit:  Provider: Erasmo Downer  Department: [facility]  Visit Type: OFFICE VISIT  Date: 05/21/2023  Medication: atorvastatin (LIPITOR) 10 MG tablet   Has the patient contacted their pharmacy? No (Agent: If no, request that the patient contact the pharmacy for the refill. If patient does not wish to contact the pharmacy document the reason why and proceed with request.) (Agent: If yes, when and what did the pharmacy advise?)  Is this the correct pharmacy for this prescription? Yes If no, delete pharmacy and type the correct one.  This is the patient's preferred pharmacy:  [facility] [address] [place] - [address] [place] [address] Phone: [phone] Fax: [phone]   Has the prescription been filled recently? Yes  Is the patient out of the medication? No  Has the patient been seen for an appointment in the last year OR does the patient have an upcoming appointment? Yes  Can we respond through MyChart? Yes  Agent: Please be advised that Rx refills may take up to 3 business days. We ask that you follow-up with your pharmacy.

## 2023-09-08 MED ORDER — ATORVASTATIN CALCIUM 10 MG PO TABS: 10.0000 mg | ORAL_TABLET | Freq: Every day | ORAL | 0 refills | Status: DC

## 2023-09-08 NOTE — Telephone Encounter (Signed)
Requested Prescriptions  Pending Prescriptions Disp Refills   atorvastatin (LIPITOR) 10 MG tablet 90 tablet 0    Sig: Take 1 tablet (10 mg total) by mouth daily.     Cardiovascular:  Antilipid - Statins Failed - 09/08/2023 10:24 AM      Failed - Lipid Panel in normal range within the last 12 months    Cholesterol, Total  Date Value Ref Range Status  07/14/2023 190 100 - 199 mg/dL Final   LDL Chol Calc (NIH)  Date Value Ref Range Status  07/14/2023 105 (H) 0 - 99 mg/dL Final   Direct LDL  Date Value Ref Range Status  10/24/2013 166.5 mg/dL Final    Comment:    Optimal:  <100 mg/dLNear or Above Optimal:  100-129 mg/dLBorderline High:  130-159 mg/dLHigh:  160-189 mg/dLVery High:  >190 mg/dL   HDL  Date Value Ref Range Status  07/14/2023 70 >39 mg/dL Final   Triglycerides  Date Value Ref Range Status  07/14/2023 85 0 - 149 mg/dL Final         Passed - Patient is not pregnant      Passed - Valid encounter within last 12 months    Recent Outpatient Visits           3 months ago COVID   [facility] [place], Marzella Schlein, MD   4 months ago Gastroesophageal reflux disease, unspecified whether esophagitis present   [facility] [place], Monico Blitz, DO   5 months ago Persistent cough for 3 weeks or longer   [facility] [place], Monico Blitz, DO

## 2023-10-06 ENCOUNTER — Telehealth: Payer: Self-pay | Admitting: Family Medicine

## 2023-10-06 ENCOUNTER — Telehealth: Payer: Self-pay

## 2023-10-06 DIAGNOSIS — K219 Gastro-esophageal reflux disease without esophagitis: Secondary | ICD-10-CM | POA: Diagnosis not present

## 2023-10-06 DIAGNOSIS — Z860100 Personal history of colon polyps, unspecified: Secondary | ICD-10-CM | POA: Diagnosis not present

## 2023-10-06 NOTE — Telephone Encounter (Signed)
 Pt is calling in because she said she has scheduled her colonoscopy for March. Pt says it will be done at [facility].

## 2023-10-06 NOTE — Telephone Encounter (Signed)
 Copied from CRM [id number]. Topic: General - Other >> Oct 06, 2023  8:00 AM Everette C wrote: Reason for CRM: The patient has called to share that they are now with [employer] for insurance coverage   The patient has called to notify the practice that they may be having a colonoscopy   The patient will be seen at [facility] to discuss further   Please contact if needed

## 2023-11-19 ENCOUNTER — Telehealth: Payer: Self-pay

## 2023-11-19 ENCOUNTER — Other Ambulatory Visit: Payer: Self-pay | Admitting: Family Medicine

## 2023-11-19 DIAGNOSIS — E7800 Pure hypercholesterolemia, unspecified: Secondary | ICD-10-CM

## 2023-11-19 NOTE — Telephone Encounter (Signed)
 Copied from CRM [id number]. Topic: Clinical - Medication Refill >> Nov 19, 2023 12:47 PM Gery Pray wrote: Most Recent Primary Care Visit:  Provider: Erasmo Downer  Department: [facility]  Visit Type: OFFICE VISIT  Date: 05/21/2023  Medication: atorvastatin (LIPITOR) 10 MG tablet  Patient would like to be prescribed 3 months at a time    Has the patient contacted their pharmacy? Yes (Agent: If no, request that the patient contact the pharmacy for the refill. If patient does not wish to contact the pharmacy document the reason why and proceed with request.) (Agent: If yes, when and what did the pharmacy advise?) Requires authorization  Is this the correct pharmacy for this prescription? Yes If no, delete pharmacy and type the correct one.  This is the patient's preferred pharmacy:  [facility] [address] [place] - [address] [place] [address] Phone: [phone] Fax: [phone]   Has the prescription been filled recently? No  Is the patient out of the medication? No  Has the patient been seen for an appointment in the last year OR does the patient have an upcoming appointment? Yes  Can we respond through MyChart? No  Agent: Please be advised that Rx refills may take up to 3 business days. We ask that you follow-up with your pharmacy.

## 2023-11-20 MED ORDER — ATORVASTATIN CALCIUM 10 MG PO TABS: 10.0000 mg | ORAL_TABLET | Freq: Every day | ORAL | 0 refills | Status: DC

## 2023-11-21 ENCOUNTER — Other Ambulatory Visit: Payer: Self-pay | Admitting: Family Medicine

## 2023-11-21 DIAGNOSIS — E7800 Pure hypercholesterolemia, unspecified: Secondary | ICD-10-CM

## 2023-11-23 ENCOUNTER — Ambulatory Visit: Payer: Medicaid Other

## 2023-11-23 DIAGNOSIS — K640 First degree hemorrhoids: Secondary | ICD-10-CM | POA: Diagnosis not present

## 2023-11-23 DIAGNOSIS — Z09 Encounter for follow-up examination after completed treatment for conditions other than malignant neoplasm: Secondary | ICD-10-CM | POA: Diagnosis not present

## 2023-11-23 DIAGNOSIS — Z860100 Personal history of colon polyps, unspecified: Secondary | ICD-10-CM | POA: Diagnosis not present

## 2023-11-23 DIAGNOSIS — K5289 Other specified noninfective gastroenteritis and colitis: Secondary | ICD-10-CM | POA: Diagnosis not present

## 2023-11-23 DIAGNOSIS — K6289 Other specified diseases of anus and rectum: Secondary | ICD-10-CM | POA: Diagnosis not present

## 2023-11-23 NOTE — Telephone Encounter (Signed)
 Request is too soon for refill.  Requested Prescriptions  Pending Prescriptions Disp Refills   atorvastatin (LIPITOR) 10 MG tablet [Pharmacy Med Name: Atorvastatin Calcium 10 MG Oral Tablet] 90 tablet 0    Sig: Take 1 tablet by mouth once daily     Cardiovascular:  Antilipid - Statins Failed - 11/23/2023  3:42 PM      Failed - Lipid Panel in normal range within the last 12 months    Cholesterol, Total  Date Value Ref Range Status  07/14/2023 190 100 - 199 mg/dL Final   LDL Chol Calc (NIH)  Date Value Ref Range Status  07/14/2023 105 (H) 0 - 99 mg/dL Final   Direct LDL  Date Value Ref Range Status  10/24/2013 166.5 mg/dL Final    Comment:    Optimal:  <100 mg/dLNear or Above Optimal:  100-129 mg/dLBorderline High:  130-159 mg/dLHigh:  160-189 mg/dLVery High:  >190 mg/dL   HDL  Date Value Ref Range Status  07/14/2023 70 >39 mg/dL Final   Triglycerides  Date Value Ref Range Status  07/14/2023 85 0 - 149 mg/dL Final         Passed - Patient is not pregnant      Passed - Valid encounter within last 12 months    Recent Outpatient Visits           6 months ago COVID   [facility] [place], Marzella Schlein, MD   6 months ago Gastroesophageal reflux disease, unspecified whether esophagitis present   [facility] [place], Sarah N, DO   7 months ago Persistent cough for 3 weeks or longer   [facility] [place], Monico Blitz, DO

## 2023-11-24 ENCOUNTER — Other Ambulatory Visit: Payer: Self-pay | Admitting: Family Medicine

## 2023-11-24 DIAGNOSIS — E7800 Pure hypercholesterolemia, unspecified: Secondary | ICD-10-CM

## 2023-11-25 NOTE — Telephone Encounter (Signed)
 Call to pharmacy- they did receive Rx from office and will fill for patient.  Requested Prescriptions  Pending Prescriptions Disp Refills   atorvastatin (LIPITOR) 10 MG tablet [Pharmacy Med Name: Atorvastatin Calcium 10 MG Oral Tablet] 90 tablet 0    Sig: Take 1 tablet by mouth once daily     Cardiovascular:  Antilipid - Statins Failed - 11/25/2023 10:43 AM      Failed - Lipid Panel in normal range within the last 12 months    Cholesterol, Total  Date Value Ref Range Status  07/14/2023 190 100 - 199 mg/dL Final   LDL Chol Calc (NIH)  Date Value Ref Range Status  07/14/2023 105 (H) 0 - 99 mg/dL Final   Direct LDL  Date Value Ref Range Status  10/24/2013 166.5 mg/dL Final    Comment:    Optimal:  <100 mg/dLNear or Above Optimal:  100-129 mg/dLBorderline High:  130-159 mg/dLHigh:  160-189 mg/dLVery High:  >190 mg/dL   HDL  Date Value Ref Range Status  07/14/2023 70 >39 mg/dL Final   Triglycerides  Date Value Ref Range Status  07/14/2023 85 0 - 149 mg/dL Final         Passed - Patient is not pregnant      Passed - Valid encounter within last 12 months    Recent Outpatient Visits           6 months ago COVID   [facility] [place], Marzella Schlein, MD   6 months ago Gastroesophageal reflux disease, unspecified whether esophagitis present   [facility] [place], Sarah N, DO   7 months ago Persistent cough for 3 weeks or longer   [facility] [place], Monico Blitz, DO

## 2023-12-23 DIAGNOSIS — E785 Hyperlipidemia, unspecified: Secondary | ICD-10-CM | POA: Diagnosis not present

## 2023-12-28 ENCOUNTER — Ambulatory Visit: Payer: Self-pay

## 2023-12-28 ENCOUNTER — Telehealth: Payer: Self-pay

## 2023-12-28 NOTE — Telephone Encounter (Signed)
 Pt notes she has reached out to the facility and the insurance company to ask questions. Nothing further needed at this time. Pt verbalized understanding and agrees to reach out to facility/insurance with additional questions.   Copied from CRM [id number]. Topic: General - Other >> Dec 28, 2023  3:58 PM Dana Carney wrote: Reason for CRM: Patient is calling back to get information about colonoscopy Reason for Disposition  General information question, no triage required and triager able to answer question  Answer Assessment - Initial Assessment Questions 1. REASON FOR CALL or QUESTION: "What is your reason for calling today?" or "How can I best help you?" or "What question do you have that I can help answer?"     Pt returning call  Protocols used: Information Only Call - No Triage-A-AH

## 2023-12-28 NOTE — Telephone Encounter (Signed)
 Copied from CRM [id number]. Topic: General - Billing Inquiry >> Dec 28, 2023 11:39 AM Elizebeth Brooking wrote: Reason for CRM: Patient called in stating she has received Notice of Denial for colonoscopy , stated she got it done at [facility] and not [facility], which was what she was billed for, stated she just wanted to call to informed incase someone has to resubmitt

## 2023-12-31 NOTE — Telephone Encounter (Signed)
 NA if patient calls back ok for E2C2 to advise

## 2024-02-17 ENCOUNTER — Other Ambulatory Visit: Payer: Self-pay | Admitting: Family Medicine

## 2024-02-17 ENCOUNTER — Telehealth: Payer: Self-pay | Admitting: Family Medicine

## 2024-02-17 DIAGNOSIS — E7800 Pure hypercholesterolemia, unspecified: Secondary | ICD-10-CM

## 2024-02-17 MED ORDER — ATORVASTATIN CALCIUM 10 MG PO TABS: 10.0000 mg | ORAL_TABLET | Freq: Every day | ORAL | 0 refills | Status: DC

## 2024-02-17 NOTE — Telephone Encounter (Unsigned)
 Copied from CRM [id number]. Topic: General - Other >> Feb 17, 2024  8:47 AM Emylou G wrote: Reason for CRM: Patient called.. wanted to know she will be [facility] ..

## 2024-02-17 NOTE — Telephone Encounter (Unsigned)
 Copied from CRM [id number]. Topic: Clinical - Medication Refill >> Feb 17, 2024  8:45 AM Emylou G wrote: Medication: atorvastatin  (LIPITOR) 10 MG tablet  Can she get 90 day supply?  Has the patient contacted their pharmacy? No (Agent: If no, request that the patient contact the pharmacy for the refill. If patient does not wish to contact the pharmacy document the reason why and proceed with request.) (Agent: If yes, when and what did the pharmacy advise?)  This is the patient's preferred pharmacy:  [facility] [address] [place] - [address] [place] [address] Phone: [phone] Fax: [phone]  Is this the correct pharmacy for this prescription? Yes If no, delete pharmacy and type the correct one.   Has the prescription been filled recently? No  Is the patient out of the medication? Yes 15 left  Has the patient been seen for an appointment in the last year OR does the patient have an upcoming appointment? Yes  Can we respond through MyChart? No  Agent: Please be advised that Rx refills may take up to 3 business days. We ask that you follow-up with your pharmacy.

## 2024-03-03 ENCOUNTER — Telehealth: Payer: Self-pay | Admitting: Family Medicine

## 2024-03-03 NOTE — Telephone Encounter (Signed)
 Pt came in the office asking if she can become a new patient and also stated she was a patient with Dr. Geralyn Knee before.Please advise.

## 2024-03-03 NOTE — Telephone Encounter (Signed)
 See me

## 2024-03-04 NOTE — Telephone Encounter (Signed)
 Patient has been called.

## 2024-05-20 ENCOUNTER — Other Ambulatory Visit: Payer: Self-pay | Admitting: Family Medicine

## 2024-05-20 DIAGNOSIS — E7800 Pure hypercholesterolemia, unspecified: Secondary | ICD-10-CM

## 2024-05-20 NOTE — Telephone Encounter (Signed)
 Copied from CRM #[id number]. Topic: Clinical - Medication Refill >> May 20, 2024  3:13 PM Yolanda T wrote: Medication: atorvastatin  (LIPITOR) 10 MG tablet  Has the patient contacted their pharmacy? No  This is the patient's preferred pharmacy:  [facility] [address] [place] - [address] [place] [address] Phone: [phone] Fax: [phone]  Is this the correct pharmacy for this prescription? Yes  Has the prescription been filled recently? Yes  Is the patient out of the medication? No  Has the patient been seen for an appointment in the last year OR does the patient have an upcoming appointment? Yes  Can we respond through MyChart? No  Agent: Please be advised that Rx refills may take up to 3 business days. We ask that you follow-up with your pharmacy.

## 2024-05-23 MED ORDER — ATORVASTATIN CALCIUM 10 MG PO TABS: 10.0000 mg | ORAL_TABLET | Freq: Every day | ORAL | 0 refills | Status: DC

## 2024-05-23 NOTE — Telephone Encounter (Signed)
 Courtesy refill given, appointment needed.   Requested Prescriptions  Pending Prescriptions Disp Refills   atorvastatin  (LIPITOR) 10 MG tablet 30 tablet 0    Sig: Take 1 tablet (10 mg total) by mouth daily. OFFICE VISIT NEEDED FOR ADDITIONAL REFILLS     Cardiovascular:  Antilipid - Statins Failed - 05/23/2024  8:37 AM      Failed - Valid encounter within last 12 months    Recent Outpatient Visits   None            Failed - Lipid Panel in normal range within the last 12 months    Cholesterol, Total  Date Value Ref Range Status  07/14/2023 190 100 - 199 mg/dL Final   LDL Chol Calc (NIH)  Date Value Ref Range Status  07/14/2023 105 (H) 0 - 99 mg/dL Final   Direct LDL  Date Value Ref Range Status  10/24/2013 166.5 mg/dL Final    Comment:    Optimal:  <100 mg/dLNear or Above Optimal:  100-129 mg/dLBorderline High:  130-159 mg/dLHigh:  160-189 mg/dLVery High:  >190 mg/dL   HDL  Date Value Ref Range Status  07/14/2023 70 >39 mg/dL Final   Triglycerides  Date Value Ref Range Status  07/14/2023 85 0 - 149 mg/dL Final         Passed - Patient is not pregnant

## 2024-05-23 NOTE — Telephone Encounter (Signed)
 OV 05/05/23- needs appointment

## 2024-06-01 ENCOUNTER — Telehealth: Payer: Self-pay

## 2024-06-01 NOTE — Telephone Encounter (Signed)
 Please see the message below.

## 2024-06-01 NOTE — Telephone Encounter (Signed)
 Copied from CRM [id number]. Topic: Clinical - Medication Question >> Jun 01, 2024  9:51 AM Deaijah H wrote: Reason for CRM: Patient called in needing a prescription for COVID vaccine, but would like to know from Dr. Donzella if she needs to take it. Please call [phone]   ----------------------------------------------------------------------- From previous Reason for Contact - Scheduling: Patient/patient representative is calling to schedule an appointment. Refer to attachments for appointment information.

## 2024-06-03 ENCOUNTER — Other Ambulatory Visit: Payer: Self-pay

## 2024-06-03 MED ORDER — COVID-19 MRNA VACC (MODERNA) 50 MCG/0.5ML IM SUSY: 0.5000 mL | PREFILLED_SYRINGE | Freq: Once | INTRAMUSCULAR | 0 refills | Status: DC

## 2024-06-03 MED ORDER — COVID-19 MRNA VACC (MODERNA) 50 MCG/0.5ML IM SUSY: 0.5000 mL | PREFILLED_SYRINGE | Freq: Once | INTRAMUSCULAR | 0 refills | Status: AC

## 2024-06-03 NOTE — Addendum Note (Signed)
 Addended by: CHERRY CHIQUITA HERO on: 06/03/2024 01:55 PM   Modules accepted: Orders

## 2024-06-03 NOTE — Addendum Note (Signed)
 Addended by: CHERRY CHIQUITA HERO on: 06/03/2024 01:56 PM   Modules accepted: Orders

## 2024-06-03 NOTE — Telephone Encounter (Signed)
 RX sent to preferred pharmacy and patient notified.

## 2024-06-03 NOTE — Telephone Encounter (Signed)
 Copied from CRM [id number]. Topic: Clinical - Medication Question >> Jun 03, 2024  1:19 PM Sophia H wrote: Reason for CRM: Patient called in to check on her request for the covid vaccine. Advised it is recommended by her provider.   Patient states her preferred brand is [employer], therapeutic and preferred pharmacy is [id number] - [place], [place] - [address]  Ty!

## 2024-06-27 DIAGNOSIS — H2513 Age-related nuclear cataract, bilateral: Secondary | ICD-10-CM | POA: Diagnosis not present

## 2024-06-27 DIAGNOSIS — M3501 Sicca syndrome with keratoconjunctivitis: Secondary | ICD-10-CM | POA: Diagnosis not present

## 2024-06-27 DIAGNOSIS — H353131 Nonexudative age-related macular degeneration, bilateral, early dry stage: Secondary | ICD-10-CM | POA: Diagnosis not present

## 2024-06-27 DIAGNOSIS — H35372 Puckering of macula, left eye: Secondary | ICD-10-CM | POA: Diagnosis not present

## 2024-07-20 ENCOUNTER — Encounter: Payer: Self-pay | Admitting: Family Medicine

## 2024-07-20 ENCOUNTER — Ambulatory Visit (INDEPENDENT_AMBULATORY_CARE_PROVIDER_SITE_OTHER): Admitting: Family Medicine

## 2024-07-20 VITALS — BP 139/78 | HR 79 | Temp 97.9°F | Ht 67.0 in | Wt 164.2 lb

## 2024-07-20 DIAGNOSIS — H353191 Nonexudative age-related macular degeneration, unspecified eye, early dry stage: Secondary | ICD-10-CM

## 2024-07-20 DIAGNOSIS — E538 Deficiency of other specified B group vitamins: Secondary | ICD-10-CM | POA: Diagnosis not present

## 2024-07-20 DIAGNOSIS — H9313 Tinnitus, bilateral: Secondary | ICD-10-CM

## 2024-07-20 DIAGNOSIS — I700 Atherosclerosis of aorta: Secondary | ICD-10-CM

## 2024-07-20 DIAGNOSIS — R739 Hyperglycemia, unspecified: Secondary | ICD-10-CM | POA: Diagnosis not present

## 2024-07-20 DIAGNOSIS — E7800 Pure hypercholesterolemia, unspecified: Secondary | ICD-10-CM | POA: Diagnosis not present

## 2024-07-20 DIAGNOSIS — R030 Elevated blood-pressure reading, without diagnosis of hypertension: Secondary | ICD-10-CM | POA: Diagnosis not present

## 2024-07-20 MED ORDER — ATORVASTATIN CALCIUM 10 MG PO TABS: 10.0000 mg | ORAL_TABLET | Freq: Every day | ORAL | 2 refills | Status: AC

## 2024-07-20 NOTE — Assessment & Plan Note (Addendum)
 Chronic, mildly elevated today.  Home readings 124-130 mmHg systolic; office readings higher, likely due to anxiety. No current antihypertensive medication.  Will consider starting medication if home readings become high. - Bring home blood pressure cuff to next visit for comparison. - Monitor blood pressure at home and report high readings. - Consider nurse visit for blood pressure check if home readings are high.

## 2024-07-20 NOTE — Patient Instructions (Signed)
Check your blood pressure once daily, and any time you have concerning symptoms like headache, chest pain, dizziness, shortness of breath, or vision changes.   Our goal is less than 140/90.  To appropriately check your blood pressure, make sure you do the following:  1) Avoid caffeine, exercise, or tobacco products for 30 minutes before checking. Empty your bladder. 2) Sit with your back supported in a flat-backed chair. Rest your arm on something flat (arm of the chair, table, etc). 3) Sit still with your feet flat on the floor, resting, for at least 5 minutes.  4) Check your blood pressure. Take 1-2 readings.  5) Write down these readings and bring with you to any provider appointments.  Bring your home blood pressure machine with you to a provider's office for accuracy comparison at least once a year.   Make sure you take your blood pressure medications before you come to any office visit, even if you were asked to fast for labs.

## 2024-07-20 NOTE — Progress Notes (Signed)
 Established patient visit   Patient: Dana Carney   DOB: Jun 16, 1951   73 y.o. Female  MRN: [id number] Visit Date: 07/20/2024  Today's healthcare provider: LAURAINE LOISE BUOY, DO   Chief Complaint  Patient presents with   Hyperlipidemia    Patient is here today overdue on needing a follow up with provider.    Got flu shot on [address] about 1 month ago.   Subjective    Hyperlipidemia  Dana Carney is a 73 year old female who presents for a follow-up visit regarding blood pressure management and medication refills.  Blood pressure readings at home are typically around 124 to 127 mmHg, with the highest being 130 mmHg. She is not currently on any antihypertensive medication. She notes that her blood pressure tends to be higher during clinic visits, which she attributes to nervousness.  She is taking 10 mg of cholesterol medication and is about to run out. She requests a refill and inquires about extending the prescription duration to avoid frequent calls for refills.  She experienced significant weight loss of about 30 pounds due to a respiratory virus but has since regained the weight. She attributes the weight gain to increased appetite and activity levels while caring for her husband, who is recovering from lung cancer surgery.  She has been diagnosed with early macular degeneration and is under the care of an ophthalmologist. She also experiences tinnitus and plans to wait until her insurance situation stabilizes next year before considering hearing aids.  She is taking vitamin B12 supplements due to previously low levels, along with calcium , glucosamine, chondroitin, and occasionally vitamin E. She has received the COVID-19 vaccine and a booster, primarily to protect her immunocompromised husband.  She has a history of receiving the hepatitis B vaccine and believes she has been screened for hepatitis C in the past.       Medications: Outpatient Medications Prior to  Visit  Medication Sig   benzonatate  (TESSALON ) 100 MG capsule Take 1 capsule (100 mg total) by mouth 2 (two) times daily as needed for cough.   calcium  gluconate 500 MG tablet Take 500 mg by mouth 2 (two) times daily.   Glucosamine-MSM-Hyaluronic Acd (JOINT HEALTH PO) Take by mouth daily.   omeprazole (PRILOSEC OTC) 20 MG tablet Take 20 mg by mouth daily.   [DISCONTINUED] atorvastatin  (LIPITOR) 10 MG tablet Take 1 tablet (10 mg total) by mouth daily. OFFICE VISIT NEEDED FOR ADDITIONAL REFILLS   No facility-administered medications prior to visit.        Objective    BP 139/78 (BP Location: Left Arm, Patient Position: Sitting, Cuff Size: Normal)   Pulse 79   Temp 97.9 F (36.6 C) (Oral)   Ht 5' 7 (1.702 m)   Wt 164 lb 3.2 oz (74.5 kg)   LMP 11/09/1987   SpO2 100%   BMI 25.72 kg/m     Physical Exam Constitutional:      Appearance: Normal appearance.  HENT:     Head: Normocephalic and atraumatic.  Eyes:     General: No scleral icterus.    Extraocular Movements: Extraocular movements intact.     Conjunctiva/sclera: Conjunctivae normal.  Cardiovascular:     Rate and Rhythm: Normal rate and regular rhythm.     Pulses: Normal pulses.     Heart sounds: Normal heart sounds.  Pulmonary:     Effort: Pulmonary effort is normal. No respiratory distress.     Breath sounds: Normal breath sounds.  Musculoskeletal:     Right lower leg: No edema.     Left lower leg: No edema.  Skin:    General: Skin is warm and dry.  Neurological:     Mental Status: She is alert and oriented to person, place, and time. Mental status is at baseline.  Psychiatric:        Mood and Affect: Mood normal.        Behavior: Behavior normal.      Results for orders placed or performed in visit on 07/20/24  Comprehensive metabolic panel with GFR  Result Value Ref Range   Glucose 104 (H) 70 - 99 mg/dL   BUN 20 8 - 27 mg/dL   Creatinine, Ser 9.22 0.57 - 1.00 mg/dL   eGFR 82 >40 fO/fpw/8.26    BUN/Creatinine Ratio 26 12 - 28   Sodium 140 134 - 144 mmol/L   Potassium 4.3 3.5 - 5.2 mmol/L   Chloride 104 96 - 106 mmol/L   CO2 22 20 - 29 mmol/L   Calcium  9.8 8.7 - 10.3 mg/dL   Total Protein 6.5 6.0 - 8.5 g/dL   Albumin 4.3 3.8 - 4.8 g/dL   Globulin, Total 2.2 1.5 - 4.5 g/dL   Bilirubin Total 0.9 0.0 - 1.2 mg/dL   Alkaline Phosphatase 84 49 - 135 IU/L   AST 24 0 - 40 IU/L   ALT 26 0 - 32 IU/L  Hemoglobin A1c  Result Value Ref Range   Hgb A1c MFr Bld 5.3 4.8 - 5.6 %   Est. average glucose Bld gHb Est-mCnc 105 mg/dL  Lipid panel  Result Value Ref Range   Cholesterol, Total 179 100 - 199 mg/dL   Triglycerides 71 0 - 149 mg/dL   HDL 64 >60 mg/dL   VLDL Cholesterol Cal 13 5 - 40 mg/dL   LDL Chol Calc (NIH) 897 (H) 0 - 99 mg/dL   Chol/HDL Ratio 2.8 0.0 - 4.4 ratio  Vitamin B12  Result Value Ref Range   Vitamin B-12 1,474 (H) 232 - 1,245 pg/mL    Assessment & Plan    Borderline hypertension Assessment & Plan: Chronic, mildly elevated today.  Home readings 124-130 mmHg systolic; office readings higher, likely due to anxiety. No current antihypertensive medication.  Will consider starting medication if home readings become high. - Bring home blood pressure cuff to next visit for comparison. - Monitor blood pressure at home and report high readings. - Consider nurse visit for blood pressure check if home readings are high.    Pure hypercholesterolemia Assessment & Plan: Chronic, managed with atorvastatin  10 mg daily. Requested refill and longer prescription duration. - Refill atorvastatin  for 3 months intervals.  Orders: -     Comprehensive metabolic panel with GFR -     Lipid panel -     Atorvastatin  Calcium ; Take 1 tablet (10 mg total) by mouth daily.  Dispense: 90 tablet; Refill: 2  Tinnitus of both ears Assessment & Plan: Ringing in ears, prefers to wait for insurance changes before audiologist referral. Manages with background noise at night. - Consider referral  to audiologist next year for hearing aids if tinnitus worsens.    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  Follows with ophthalmology; defer to specialist management  General Health Maintenance Received COVID-19 booster. Plans for mammogram and needs records sent to [facility]. - Schedule fasting blood work for next morning. - Ensure mammogram records sent to [facility].  Will plan to do future mammograms at the [facility] due to insurance change.    Follow-up Plans to return for fasting blood work and potential blood pressure check. Will monitor blood pressure at home and report concerns. - Return for fasting blood work at 8 AM. - Monitor blood pressure at home and report concerns.  Return in about 6 months (around 01/18/2025) for Chronic f/u.      I discussed the assessment and treatment plan with the patient  The patient was provided an opportunity to ask questions and all were answered. The patient agreed with the plan and demonstrated an understanding of the instructions.   The patient was advised to call back or seek an in-person evaluation if the symptoms worsen or if the condition fails to improve as anticipated.    LAURAINE LOISE BUOY, DO  [facility] [phone] (phone) [phone] (fax)  [facility]

## 2024-07-21 ENCOUNTER — Telehealth: Payer: Self-pay | Admitting: Family Medicine

## 2024-07-21 NOTE — Telephone Encounter (Signed)
 Patient stopped by to let you know that her BP was 137 but she wasn't sure what the bottom # was.    I made sure she wasn't talking about her blood sugar.    Anyway, she is going to do some walking today to try and get it down.

## 2024-07-22 LAB — COMPREHENSIVE METABOLIC PANEL WITH GFR
ALT: 26 IU/L (ref 0–32)
AST: 24 IU/L (ref 0–40)
Albumin: 4.3 g/dL (ref 3.8–4.8)
Alkaline Phosphatase: 84 IU/L (ref 49–135)
BUN/Creatinine Ratio: 26 (ref 12–28)
BUN: 20 mg/dL (ref 8–27)
Bilirubin Total: 0.9 mg/dL (ref 0.0–1.2)
CO2: 22 mmol/L (ref 20–29)
Calcium: 9.8 mg/dL (ref 8.7–10.3)
Chloride: 104 mmol/L (ref 96–106)
Creatinine, Ser: 0.77 mg/dL (ref 0.57–1.00)
Globulin, Total: 2.2 g/dL (ref 1.5–4.5)
Glucose: 104 mg/dL — ABNORMAL HIGH (ref 70–99)
Potassium: 4.3 mmol/L (ref 3.5–5.2)
Sodium: 140 mmol/L (ref 134–144)
Total Protein: 6.5 g/dL (ref 6.0–8.5)
eGFR: 82 mL/min/1.73 (ref >59)

## 2024-07-22 LAB — LIPID PANEL
Chol/HDL Ratio: 2.8 ratio (ref 0.0–4.4)
Cholesterol, Total: 179 mg/dL (ref 100–199)
HDL: 64 mg/dL (ref >39)
LDL Chol Calc (NIH): 102 mg/dL — ABNORMAL HIGH (ref 0–99)
Triglycerides: 71 mg/dL (ref 0–149)
VLDL Cholesterol Cal: 13 mg/dL (ref 5–40)

## 2024-07-22 LAB — HEMOGLOBIN A1C
Est. average glucose Bld gHb Est-mCnc: 105 mg/dL
Hgb A1c MFr Bld: 5.3 % (ref 4.8–5.6)

## 2024-07-22 LAB — VITAMIN B12: Vitamin B-12: 1474 pg/mL — ABNORMAL HIGH (ref 232–1245)

## 2024-07-27 ENCOUNTER — Encounter: Payer: Self-pay | Admitting: Family Medicine

## 2024-07-27 ENCOUNTER — Ambulatory Visit: Payer: Self-pay | Admitting: Family Medicine

## 2024-07-27 DIAGNOSIS — H9313 Tinnitus, bilateral: Secondary | ICD-10-CM | POA: Insufficient documentation

## 2024-07-27 NOTE — Assessment & Plan Note (Signed)
 Ringing in ears, prefers to wait for insurance changes before audiologist referral. Manages with background noise at night. - Consider referral to audiologist next year for hearing aids if tinnitus worsens.

## 2024-07-27 NOTE — Assessment & Plan Note (Signed)
 Chronic, managed with atorvastatin  10 mg daily. Requested refill and longer prescription duration. - Refill atorvastatin  for 3 months intervals.

## 2024-07-28 NOTE — Telephone Encounter (Signed)
 Noted

## 2024-08-03 ENCOUNTER — Encounter: Payer: Self-pay | Admitting: Family Medicine

## 2024-08-03 DIAGNOSIS — Z1231 Encounter for screening mammogram for malignant neoplasm of breast: Secondary | ICD-10-CM | POA: Diagnosis not present

## 2024-08-03 LAB — HM MAMMOGRAPHY

## 2024-08-06 ENCOUNTER — Ambulatory Visit: Payer: Self-pay | Admitting: Family Medicine
